# Patient Record
Sex: Male | Born: 1985 | Race: White | Hispanic: No | Marital: Single | Smoking: Current every day smoker
Health system: Southern US, Community
[De-identification: ages and names within clinical notes are randomized; demographics above are authoritative.]

## PROBLEM LIST (undated history)

## (undated) DIAGNOSIS — F909 Attention-deficit hyperactivity disorder, unspecified type: Secondary | ICD-10-CM

## (undated) DIAGNOSIS — F1010 Alcohol abuse, uncomplicated: Secondary | ICD-10-CM

## (undated) DIAGNOSIS — I10 Essential (primary) hypertension: Secondary | ICD-10-CM

## (undated) DIAGNOSIS — F32A Depression, unspecified: Secondary | ICD-10-CM

## (undated) DIAGNOSIS — F329 Major depressive disorder, single episode, unspecified: Secondary | ICD-10-CM

## (undated) DIAGNOSIS — F319 Bipolar disorder, unspecified: Secondary | ICD-10-CM

## (undated) DIAGNOSIS — E785 Hyperlipidemia, unspecified: Secondary | ICD-10-CM

---

## 2007-06-04 ENCOUNTER — Inpatient Hospital Stay (HOSPITAL_COMMUNITY): Admission: EM | Admit: 2007-06-04 | Discharge: 2007-06-07 | Payer: Self-pay | Admitting: *Deleted

## 2007-06-04 ENCOUNTER — Emergency Department (HOSPITAL_COMMUNITY): Admission: EM | Admit: 2007-06-04 | Discharge: 2007-06-04 | Payer: Self-pay | Admitting: Emergency Medicine

## 2007-06-05 ENCOUNTER — Ambulatory Visit: Payer: Self-pay | Admitting: *Deleted

## 2007-06-26 ENCOUNTER — Inpatient Hospital Stay (HOSPITAL_COMMUNITY): Admission: AD | Admit: 2007-06-26 | Discharge: 2007-07-05 | Payer: Self-pay | Admitting: Psychiatry

## 2011-02-02 NOTE — H&P (Signed)
Luis Acosta, Luis Acosta              ACCOUNT NO.:  [id number]   MEDICAL RECORD NO.:  [id number]          PATIENT TYPE:  IPS   LOCATION:  0403                          FACILITY:  BH   PHYSICIAN:  Geoffery Lyons, M.D.      DATE OF BIRTH:  July 12, 1986   DATE OF ADMISSION:  06/04/2007  DATE OF DISCHARGE:                       PSYCHIATRIC ADMISSION ASSESSMENT   IDENTIFYING INFORMATION:  This is a 25 year old white male, single.  This is an involuntary admission.   HISTORY OF PRESENT ILLNESS:  This patient was brought to the emergency  room by his parents for bizarre behavior.  He had gotten agitated and  through a television out the window saying that he was not sure what was  real and what was not real but felt that TV was part of promoting things  that were not real and it would be better to just get rid of it.  He  denies any current history of substance abuse.  He reports that he has  been under a lot of stress, feeling that work is too much for him.  He  is taking too many classes.  Not sure where to turn with all of his  anxiety.  Had denied any suicidal thoughts.  He denies any current  substance abuse and his urine drug screen was negative.   PAST PSYCHIATRIC HISTORY:  This is the patient's first admission to  [facility], first inpatient admission.  No  known history of either inpatient or outpatient psychiatric care.  Denies any history of psychotropics.  Denies history of brain injury,  learning disability, or regular substance abuse.  He does report that he  has a history of one blackout in the past when he was drinking too much  alcohol.  Denies any prior history of suicidal thoughts or attempts.   SOCIAL HISTORY:  Single, white male, never married.  No children.  He is  a Holiday representative at [employer].  Studying business, making A's and B's in  school.  Reports that he has one sister [place] in a  [place].  The patient himself is currently  living in a dormitory   FAMILY HISTORY:  Remarkable for mother with bipolar, type 1, disorder.   MEDICAL HISTORY:  Primary care Lesly Joslyn is not known.  He denies any  current medical problems.  Past medical history is negative for seizure.  He has a history of prior wisdom tooth extraction as an outpatient and  at one time fell from a ladder and lacerated his leg.  He has one  hospitalization in the ICU for an anaphylactic reaction to STRAWBERRIES  in the past.   MEDICATIONS:  Currently are none.   ALLERGIES:  None.   POSITIVE PHYSICAL FINDINGS:  Full physical exam was done in the  emergency room.  On presentation, generally a healthy-appearing white  male, afebrile, temperature 97.3, pulse 108, respirations 18, blood  pressure 160/97.   LABORATORY DATA:  Urine drug screen was negative for all substances.  Acetaminophen and salicylate levels were unremarkable.  Routine  urinalysis within normal limits.  Alcohol  level less than 5.  Chemistry  was remarkable for a potassium of 3.3 and CBC mild elevation and  leukocytes of 12.7.  Hemoglobin 16.3, hematocrit 47.7 and platelets  365,000, MCV at 89.9.  Renal function within normal limits.  Hepatic  function and TSH are currently pending.   MENTAL STATUS EXAM:  Fully alert male.  He is cooperative and pleasant  with a guarded affect.  Has been pretty reclusive to his room today.  He  is polite, cooperative.  Speech reflects increased production, no  pressure, is hyperverbal.  Mood mildly anxious.  Affect is guarded.  Thought process reflects quite a bit of rumination about his stressors,  where he is in the world, feeling that maybe he does not need to be in  school at all, needs to get a job, go away, experienced the world,  having difficulty deciding what he wants to do.  No clear suicidal  thoughts.  Has made some references to evil, evil voices, exactly the  nature of this is unclear and he becomes quite guarded.  Some loosening   of associations.  He is oriented to person, place and situation.  Has  made references that he feels his parents are supportive and willing to  talk about his sister and what group she is in.  Has made references to  her being in a cult.  No clear homicidal thoughts.  No expression of  suicidal thoughts.   DIAGNOSES:  AXIS I:  Rule out bipolar disorder with psychosis.  AXIS II:  Deferred.  AXIS III:  No diagnosis.  AXIS IV:  Moderate to severe (school stressors).  AXIS V:  Current 25-26; past year not known.   PLAN:  To involuntarily admit the patient.  He is in our intensive care  unit and we have prescribed Risperdal p.r.n. 0.5 mg q.4h. M-Tab and  Ativan 1 mg q.4h. p.r.n. for anxiety.  We are going to try to get in  touch with his parents and try to get some corroboration.  He tried to  determine what of his perceptions may be delusional versus real.  He has  also made some references to being concerned about the media and  reviewing May 31, 2000 terrorists' concerns.  Parents are willing  to come to a family session tomorrow.  We will also check a TSH and  hepatic function which are currently pending.   ESTIMATED LENGTH OF STAY:  Five days.      Margaret A. Scott, N.P.      Geoffery Lyons, M.D.  Electronically Signed    MAS/MEDQ  D:  06/05/2007  T:  06/05/2007  Job:  [id number]

## 2011-02-02 NOTE — Discharge Summary (Signed)
Luis Acosta, TUCKEY NO.:  [id number]   MEDICAL RECORD NO.:  [id number]          PATIENT TYPE:  IPS   LOCATION:  0403                          FACILITY:  BH   PHYSICIAN:  Geoffery Lyons, M.D.      DATE OF BIRTH:  1985-11-17   DATE OF ADMISSION:  06/04/2007  DATE OF DISCHARGE:  06/07/2007                               DISCHARGE SUMMARY   CHIEF COMPLAINT/HISTORY OF PRESENT ILLNESS:  This was the first  admission to [facility] for this 25 year old white male,  single, voluntarily committed brought to the ED by his parents for  bizarre behavior. I got agitated and threw a television out the  window, saying he was not sure what was real or what was not.  He felt  that the TV was wrong, promoting things that were not real and it would  be better to just get rid of it.  He is under a lot of stress, feeling  that work is too much for him and he is taking too many classes.   PAST PSYCHIATRIC HISTORY:  This is the first time at [facility].  No current treatment.   ALCOHOL HISTORY:  No prior history.  History of one blackout in the past  when was drinking too much.   SUBSTANCE ABUSE HISTORY:  No current substance use.  UDS negative.   MEDICAL HISTORY:  Noncontributory.   MEDICATIONS:  None.   PHYSICAL EXAMINATION:  Physical exam was performed and failed to show  any acute findings.   LABORATORY WORK-UP:  Potassium 3.3.  CBC - leukocytes 12.7, hemoglobin  16.3.  Renal function within normal limits.  Hepatic function and TSH  within normal limits.   MENTAL STATUS EXAM:  Reveals an alert, cooperative male, pleasant,  somewhat guarded.  Reclusive but polite and cooperative, hyper-verbal,  anxious,  rumination about his stressors, where he is in the world;  feeling that maybe he does not need to be in school at all.  He needs to  get a job, go away, be in the world.  He is having difficulty what he  wants to do.  He has made some reference to evil  voices.  He would not  elaborate and would not clarify circumstantiality.  Cognition well-  preserved.   ADMITTING DIAGNOSES:  AXIS I:  Rule out bipolar with psychoses, rule out  psychotic NOS, rule out schizoaffective disorder.  AXIS II:  No diagnosis.  AXIS III:  No diagnosis.  AXIS IV:  Moderate.  GAF on admission 25.  GAF in the last year 70-75.   COURSE IN THE HOSPITAL:  He was admitted, started in individual and  group psychotherapy.  As previously stated, this is a 25 year old male.  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  Endorsed that he really did not know what was going on.  He  claims he is confused on what is real and what is not.  He wants to know  what is [facility] and what is not, if all those people are dying.  He  wanted to go and see for himself.  He has had difficulty with sleep over  the last few days.  He was still going to school up until this week.  He  is still worried.  He recently broke up with a girlfriend but could not  see how this affected him.  He threw out the TV and then later picked it  up and threw it in front of the American flag.  No alcohol for the last  year.  Diagnosed with ADHD.  He was given Adderall at [facility] and  [facility].   FAMILY HISTORY:  Apparently mom was diagnosed with bipolar disorder.  He  basically was able to keep himself isolated and did interact with some  patients.  He was able to pull himself together to exhibit also  appropriate behavior.  During family session with his parents his mother  shared that she was bipolar and has been on treatment for a long time.  She admitted that she did not think her son could make it in college.  He endorsed that he was angry at her for telling him how limited or un-  special he was.  He wanted to prove to her that he is as smart and  capable as other people in the family who are successful.  He did not  want to take medications and wanted  to prove to people that he was  normal and also because of all the negative things the mother has said  about the medications through the years.  He wanted to go back to  school.  Mother was recommending that he drop some classes and he was  upset because of that.   On September 16th he was still firm that he was not going to take  medications.  Endorsed that his mother has had mental issues all through  his growing up years.  By the 16th there was some ruminations but were  decreasing in intensity.  He was able to stay on task and able to  tolerate interaction without overt paranoia.  He is still worried about  the situation in the world, but able to convince himself to go back to  school and finish his degree and then be able to go out and help people  if he needed to do so.  September 17th, endorsed that his medication was  being in the unit, feeling that by removing himself from the situation  he was able to appreciate what was going on with him.  Endorsed he was  under a lot of stress trying to normalize some of his feelings and  reactions.  He denied any suicidal ideation or homicidal ideation.  There was no overt paranoia.  There were some ruminations but none of  this would justify having to force medications on him.  He wants to go  back to school and is willing to see a counselor.   DISCHARGE DIAGNOSES:  AXIS I:  Psychotic disorder, not otherwise  specified.  Bipolar disorder with psychotic features.  AXIS II:  No diagnosis.  AXIS III:  No diagnosis.  AXIS IV:  Moderate.  GAF on discharge 50.   DISCHARGE MEDICATIONS:  Discharged on no medications.   FOLLOW-UP:  Follow up at the [facility]      Geoffery Lyons, M.D.  Electronically Signed     IL/MEDQ  D:  06/27/2007  T:  06/28/2007  Job:  [id number]

## 2011-02-05 NOTE — Discharge Summary (Signed)
NAMEKYUSS, HALE NO.:  [id number]   MEDICAL RECORD NO.:  [id number]          PATIENT TYPE:  IPS   LOCATION:  0503                          FACILITY:  BH   PHYSICIAN:  Geoffery Lyons, M.D.      DATE OF BIRTH:  02/28/1986   DATE OF ADMISSION:  06/26/2007  DATE OF DISCHARGE:  07/05/2007                               DISCHARGE SUMMARY   CHIEF COMPLAINT AND PRESENT ILLNESS:  This was the second admission to  [facility] for this 25 year old single white male  voluntarily admitted.  Apparently, he had a verbal altercation with his  roommate.  He was rambling.  He exhibited some grandiose thinking; that  he could communicate in multiple languages the argument seems to have  escalated.  He was asked to leave the dorm.   PAST PSYCHIATRIC HISTORY:  He was admitted previously to [facility].  At that particular time, he was also exhibiting mood  fluctuations with what seemed to be some psychotic features.   ALCOHOL/DRUG HISTORY:  He denies abuse of any substances.   MEDICAL HISTORY:  Noncontributory.   MEDICATIONS:  None.   PHYSICAL EXAMINATION:  Performed.  Failed to show any acute findings.   LABORATORY WORKUP:  White blood cells 9.2, hemoglobin 14.8, sodium 140,  potassium 4.2, glucose 99, SGOT 25, SGPT 31, total bilirubin 0.7, TSH  1.349.  Drug screen negative for substances of abuse.   MENTAL STATUS EXAM:  Reveals an alert, cooperative male initially  somewhat resistant, somewhat guarded.  His spontaneous thought  production was somewhat irrelevant, rambling in nature.  Some underlying  grandiosity.  Some underlying paranoia.  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  Cognition  preserved.   ADMISSION DIAGNOSIS:  AXIS I: Rule out bipolar disorder with psychotic  features, rule out psychosis NOS.  AXIS II: No  diagnosis.  AXIS III:  No diagnosis.  AXIS IV: Moderate.  AXIS V:  Upon admission 35.  GAF in last year 70.   HOSPITAL COURSE:  He was admitted.  He was started in individual and  group psychotherapy.  Initially, he was very hesitant to take  medications.  We placed him on Zyprexa on an as needed basis.  As  already stated, a 25 year old may recently discharged from this unit on  no medications as he refused to take them.  Claimed he had any event  with a roommate.  He got upset.  On evaluation, he is very disorganized.  There are some loose associations, some tangentiality, some delusional  ideas, grandiosity, ruminations.  Wanting to go to the [place]  and disappear there.  Wants to start all over again.  He was questioning  peoples' intentions, sanity.  He was pretty labile, pretty anxious,  irritable, unwilling to take medications.  Asked if this decision was  going to strap him down and force medicine on him.   On October 7, we got a second opinion by Dr. Electa Sniff  who points out the  fact that he was delusional, agitated, grandiose, tangential, with no  insight.  His opinion was that he was not going to improve clinically  without medications and forcing medication could be justified.  We went  back with some other staff members to try to interact and get him to  take the medications.  He continued to be very disorganized.  There were  some __________  associations and perseveration, but through the  interaction with the unit nurse director, he was willing to take  medications.  He was compliant with taking Zyprexa from then on.  He was  compliant with medications which ever medication was recommended.  We  pursued the Zyprexa.  We added Depakote.  On October 8, there were some  episodes of agitation, but he did accept the Zyprexa.  We adjusted the  medication.  Zyprexa was increased up to 20 mg.  On October 10, he was  feeling caged in.  There was some acting out, but he  was able to be  redirected.  He was taking all of the medications offered.  He heard  that he was not going to be able to go back to the same place he was  staying at and also that he was probably going to be better if he  totally withdrew from school.  Started thinking that he would be better  off going to [place] with his father while he gets himself back  together.  He was sleeping through the night.  On October 12, he  continued to be somewhat hypomanic, hyperverbal, expansive.  On October  13, he was circumstantial at times, tangential, expansive; but he was  taking his medication.  He was tolerating the medications well.  At that  particular time, Depakote level was 46.  He was willing to take a  Medical withdrawal, go to [place], find a job, get himself better and  get back to school possibly spring semester if not later.  On October  14, he was more focused, organized in his thinking process.  Was going  to get a withdrawal so he can get some reimbursement.  Still was wanting  to get the medical withdrawal and find a job in [place].  There was  some concern as far as the way people were going to look at him after  this incident; so there was some increased insight as this whole process  continued to evolve.  We continued to work with the Zyprexa and the  Depakote.  There was a family session on October 14.  He was willing to  stay on his medications and pursue outpatient treatment.  On October 15,  he was much improved, more organized, no suicidal or homicidal ideas, no  visual hallucinations or delusions.  Endorsed he was going to keep the  medication.  He was going to follow up on an outpatient basis and  depending on how things went, be back in school spring or 12/2007.   DISCHARGE DIAGNOSIS:  AXIS I: Bipolar disorder with some psychotic  features.  AXIS II: No diagnosis.  AXIS III:  No diagnosis.  AXIS IV: Moderate.  AXIS V: Upon discharge 50.   DISCHARGE  MEDICATIONS:  1. Zyprexa Zydis 20 mg at bedtime.  2. Depakote ER 1500 mg at bedtime.  3. Trazodone 50 mg at bedtime as needed for sleep.   DISCHARGE FOLLOWUP:  Followup is at [facility] in [place],  [place].  Geoffery Lyons, M.D.  Electronically Signed     IL/MEDQ  D:  07/31/2007  T:  08/01/2007  Job:  [id number]

## 2011-07-01 LAB — DRUGS OF ABUSE SCREEN W/O ALC, ROUTINE URINE
Amphetamine Screen, Ur: NEGATIVE
Benzodiazepines.: NEGATIVE
Creatinine,U: 300.7
Marijuana Metabolite: NEGATIVE
Methadone: NEGATIVE
Propoxyphene: NEGATIVE

## 2011-07-01 LAB — URINALYSIS, ROUTINE W REFLEX MICROSCOPIC
Bilirubin Urine: NEGATIVE
Glucose, UA: NEGATIVE
Hgb urine dipstick: NEGATIVE
Ketones, ur: NEGATIVE
Nitrite: NEGATIVE
Protein, ur: NEGATIVE
Specific Gravity, Urine: 1.037 — ABNORMAL HIGH
Urobilinogen, UA: 0.2
pH: 6
pH: 6.5

## 2011-07-01 LAB — COMPREHENSIVE METABOLIC PANEL
ALT: 31
AST: 25
Alkaline Phosphatase: 86
CO2: 30
Chloride: 101
GFR calc Af Amer: >60
GFR calc non Af Amer: >60
Glucose, Bld: 99
Potassium: 4.2
Sodium: 140
Total Bilirubin: 0.7

## 2011-07-01 LAB — MAGNESIUM: Magnesium: 2.2

## 2011-07-01 LAB — CBC
Hemoglobin: 14.8
MCHC: 34.2
RBC: 4.84
WBC: 9.2

## 2011-07-02 LAB — DIFFERENTIAL
Basophils Relative: 0
Eosinophils Relative: 0
Lymphocytes Relative: 21
Monocytes Absolute: 0.8 — ABNORMAL HIGH
Monocytes Relative: 7
Neutro Abs: 9.1 — ABNORMAL HIGH

## 2011-07-02 LAB — CBC
HCT: 47.7
Hemoglobin: 16.3
MCHC: 34.2
RBC: 5.3
RDW: 13.5

## 2011-07-02 LAB — URINALYSIS, ROUTINE W REFLEX MICROSCOPIC
Nitrite: NEGATIVE
Protein, ur: NEGATIVE
Specific Gravity, Urine: 1.006
Urobilinogen, UA: 0.2

## 2011-07-02 LAB — SALICYLATE LEVEL
Salicylate Lvl: <4
Salicylate Lvl: <4

## 2011-07-02 LAB — I-STAT 8, (EC8 V) (CONVERTED LAB)
BUN: 6
Chloride: 105
Glucose, Bld: 104 — ABNORMAL HIGH
HCT: 52
Hemoglobin: 17.7 — ABNORMAL HIGH
Operator id: 257131
Sodium: 140

## 2011-07-02 LAB — RAPID URINE DRUG SCREEN, HOSP PERFORMED
Amphetamines: NOT DETECTED
Barbiturates: NOT DETECTED

## 2011-07-02 LAB — ACETAMINOPHEN LEVEL: Acetaminophen (Tylenol), Serum: <10 — ABNORMAL LOW

## 2014-11-12 DIAGNOSIS — I10 Essential (primary) hypertension: Secondary | ICD-10-CM | POA: Insufficient documentation

## 2015-10-29 DIAGNOSIS — F3181 Bipolar II disorder: Secondary | ICD-10-CM | POA: Diagnosis present

## 2015-10-29 DIAGNOSIS — F1021 Alcohol dependence, in remission: Secondary | ICD-10-CM | POA: Insufficient documentation

## 2016-02-02 DIAGNOSIS — Z6835 Body mass index (BMI) 35.0-35.9, adult: Secondary | ICD-10-CM | POA: Insufficient documentation

## 2016-03-26 ENCOUNTER — Ambulatory Visit (HOSPITAL_BASED_OUTPATIENT_CLINIC_OR_DEPARTMENT_OTHER): Payer: BLUE CROSS/BLUE SHIELD | Attending: Family Medicine | Admitting: Internal Medicine

## 2016-03-26 VITALS — Ht 73.0 in | Wt 290.0 lb

## 2016-03-26 DIAGNOSIS — R0683 Snoring: Secondary | ICD-10-CM | POA: Diagnosis not present

## 2016-03-26 DIAGNOSIS — R5383 Other fatigue: Secondary | ICD-10-CM | POA: Insufficient documentation

## 2016-03-26 DIAGNOSIS — E669 Obesity, unspecified: Secondary | ICD-10-CM | POA: Insufficient documentation

## 2016-03-26 DIAGNOSIS — R51 Headache: Secondary | ICD-10-CM | POA: Insufficient documentation

## 2016-03-26 DIAGNOSIS — G478 Other sleep disorders: Secondary | ICD-10-CM | POA: Insufficient documentation

## 2016-03-26 DIAGNOSIS — G4700 Insomnia, unspecified: Secondary | ICD-10-CM | POA: Insufficient documentation

## 2016-03-26 DIAGNOSIS — Z6838 Body mass index (BMI) 38.0-38.9, adult: Secondary | ICD-10-CM | POA: Insufficient documentation

## 2016-04-03 DIAGNOSIS — G478 Other sleep disorders: Secondary | ICD-10-CM | POA: Diagnosis not present

## 2016-04-03 NOTE — Procedures (Signed)
  Patient Name: Luis Acosta, Luis Acosta Date: 03/26/2016 Gender: Male D.O.B: 03/28/1986 Age (years): 29 Referring Provider: Willow Oraamille L Andy Height (inches): 73 Interpreting Physician: Jetty Duhamellinton Young MD, ABSM Weight (lbs): 290 RPSGT: Shelah LewandowskyGregory, Kenyon BMI: 38 MRN: [id number] Neck Size: 17.50 CLINICAL INFORMATION Sleep Acosta Type: NPSG Indication for sleep Acosta: Daytime Fatigue, Fatigue, Morning Headaches, Obesity, Parasomnias, Snoring, Witnesses Apnea / Gasping During Sleep Epworth Sleepiness Score: 5  SLEEP Acosta TECHNIQUE As per the AASM Manual for the Scoring of Sleep and Associated Events v2.3 (April 2016) with a hypopnea requiring 4% desaturations. The channels recorded and monitored were frontal, central and occipital EEG, electrooculogram (EOG), submentalis EMG (chin), nasal and oral airflow, thoracic and abdominal wall motion, anterior tibialis EMG, snore microphone, electrocardiogram, and pulse oximetry.  MEDICATIONS Patient's medications include: charted fopr review. Medications self-administered by patient during sleep Acosta : No sleep medicine administered.  SLEEP ARCHITECTURE The Acosta was initiated at 10:31:31 PM and ended at 4:47:56 AM. Sleep onset time was 63.4 minutes and the sleep efficiency was 48.2%. The total sleep time was 181.5 minutes. Stage REM latency was 237.0 minutes. The patient spent 19.85% of the night in stage N1 sleep, 63.07% in stage N2 sleep, 7.99% in stage N3 and 9.09% in REM. Alpha intrusion was absent. Supine sleep was 50.43%. Wake after sleep onset 131 minutes  RESPIRATORY PARAMETERS The overall apnea/hypopnea index (AHI) was 2.0 per hour. There were 1 total apneas, including 0 obstructive, 1 central and 0 mixed apneas. There were 5 hypopneas and 68 RERAs. The AHI during Stage REM sleep was 0.0 per hour. AHI while supine was 2.6 per hour. The mean oxygen saturation was 95.83%. The minimum SpO2 during sleep was 93.00%. Moderate snoring was  noted during this Acosta.  CARDIAC DATA The 2 lead EKG demonstrated sinus rhythm. The mean heart rate was 73.11 beats per minute. Other EKG findings include: None.  LEG MOVEMENT DATA The total PLMS were 0 with a resulting PLMS index of 0.00. Associated arousal with leg movement index was 0.0 .  IMPRESSIONS - No significant obstructive sleep apnea occurred during this Acosta (AHI = 2.0/h). - No significant central sleep apnea occurred during this Acosta (CAI = 0.3/h). - The patient had minimal or no oxygen desaturation during the Acosta (Min O2 = 93.00%) - The patient snored with Moderate snoring volume. - Patient had significant difficulty initiating and maintaining sleep- insomnia - No cardiac abnormalities were noted during this Acosta. - Clinically significant periodic limb movements did not occur during sleep. No significant associated arousals.  DIAGNOSIS - Primary Snoring (786.09 [R06.83 ICD-10]) - Normal Acosta - Difficulty initiating and maintaining sleep- Insomnia  RECOMMENDATIONS - Avoid alcohol, sedatives and other CNS depressants that may worsen sleep apnea and disrupt normal sleep architecture. - Sleep hygiene should be reviewed to assess factors that may improve sleep quality. - Consider if insomnia is a sustained/ recurrent problem needing specific attention. - Weight management and regular exercise should be initiated or continued if appropriate.  [Electronically signed] 04/03/2016 09:17 AM  Jetty Duhamellinton Young MD, ABSM Diplomate, American Board of Sleep Medicine   NPI: [id number]  Waymon BudgeYOUNG,CLINTON D Diplomate, American Board of Sleep Medicine  ELECTRONICALLY SIGNED ON:  04/03/2016, 9:11 AM [facility] PH: [phone]   FX: [phone] ACCREDITED BY THE AMERICAN ACADEMY OF SLEEP MEDICINE

## 2016-06-01 DIAGNOSIS — F17210 Nicotine dependence, cigarettes, uncomplicated: Secondary | ICD-10-CM | POA: Insufficient documentation

## 2016-06-18 ENCOUNTER — Encounter (HOSPITAL_COMMUNITY): Payer: Self-pay | Admitting: Emergency Medicine

## 2016-06-18 ENCOUNTER — Emergency Department (HOSPITAL_COMMUNITY)
Admission: EM | Admit: 2016-06-18 | Discharge: 2016-06-18 | Payer: BLUE CROSS/BLUE SHIELD | Attending: Emergency Medicine | Admitting: Emergency Medicine

## 2016-06-18 DIAGNOSIS — R45851 Suicidal ideations: Secondary | ICD-10-CM | POA: Diagnosis present

## 2016-06-18 DIAGNOSIS — F3163 Bipolar disorder, current episode mixed, severe, without psychotic features: Secondary | ICD-10-CM | POA: Diagnosis not present

## 2016-06-18 DIAGNOSIS — Z79899 Other long term (current) drug therapy: Secondary | ICD-10-CM | POA: Diagnosis not present

## 2016-06-18 DIAGNOSIS — F17200 Nicotine dependence, unspecified, uncomplicated: Secondary | ICD-10-CM | POA: Insufficient documentation

## 2016-06-18 DIAGNOSIS — F3160 Bipolar disorder, current episode mixed, unspecified: Secondary | ICD-10-CM | POA: Diagnosis present

## 2016-06-18 LAB — RAPID URINE DRUG SCREEN, HOSP PERFORMED
Amphetamines: POSITIVE — AB
Barbiturates: NOT DETECTED
Benzodiazepines: NOT DETECTED
Cocaine: NOT DETECTED
Opiates: NOT DETECTED
Tetrahydrocannabinol: NOT DETECTED

## 2016-06-18 LAB — CBC
HEMATOCRIT: 45.9 % (ref 39.0–52.0)
Hemoglobin: 16.2 g/dL (ref 13.0–17.0)
MCH: 32.1 pg (ref 26.0–34.0)
MCHC: 35.3 g/dL (ref 30.0–36.0)
MCV: 90.9 fL (ref 78.0–100.0)
PLATELETS: 322 10*3/uL (ref 150–400)
RBC: 5.05 MIL/uL (ref 4.22–5.81)
RDW: 13 % (ref 11.5–15.5)
WBC: 11 10*3/uL — AB (ref 4.0–10.5)

## 2016-06-18 LAB — COMPREHENSIVE METABOLIC PANEL
ALT: 162 U/L — ABNORMAL HIGH (ref 17–63)
AST: 73 U/L — ABNORMAL HIGH (ref 15–41)
Albumin: 5.1 g/dL — ABNORMAL HIGH (ref 3.5–5.0)
Alkaline Phosphatase: 77 U/L (ref 38–126)
Anion gap: 12 (ref 5–15)
BUN: 10 mg/dL (ref 6–20)
CO2: 21 mmol/L — ABNORMAL LOW (ref 22–32)
Calcium: 9.4 mg/dL (ref 8.9–10.3)
Chloride: 106 mmol/L (ref 101–111)
Creatinine, Ser: 0.97 mg/dL (ref 0.61–1.24)
GFR calc Af Amer: >60 mL/min (ref >60)
GFR calc non Af Amer: >60 mL/min (ref >60)
Glucose, Bld: 110 mg/dL — ABNORMAL HIGH (ref 65–99)
Potassium: 3.1 mmol/L — ABNORMAL LOW (ref 3.5–5.1)
Sodium: 139 mmol/L (ref 135–145)
Total Bilirubin: 0.7 mg/dL (ref 0.3–1.2)
Total Protein: 8.4 g/dL — ABNORMAL HIGH (ref 6.5–8.1)

## 2016-06-18 LAB — ACETAMINOPHEN LEVEL: Acetaminophen (Tylenol), Serum: <10 ug/mL — ABNORMAL LOW (ref 10–30)

## 2016-06-18 LAB — SALICYLATE LEVEL: Salicylate Lvl: <4 mg/dL (ref 2.8–30.0)

## 2016-06-18 LAB — ETHANOL

## 2016-06-18 MED ORDER — HALOPERIDOL LACTATE 5 MG/ML IJ SOLN
5.0000 mg | Freq: Once | INTRAMUSCULAR | Status: AC
  Administered 2016-06-18: 5 mg via INTRAMUSCULAR
  Filled 2016-06-18: qty 1

## 2016-06-18 MED ORDER — ZIPRASIDONE MESYLATE 20 MG IM SOLR
20.0000 mg | INTRAMUSCULAR | Status: AC | PRN
  Administered 2016-06-18: 20 mg via INTRAMUSCULAR
  Filled 2016-06-18: qty 20

## 2016-06-18 MED ORDER — LORAZEPAM 2 MG/ML IJ SOLN
2.0000 mg | Freq: Once | INTRAMUSCULAR | Status: AC
  Administered 2016-06-18: 2 mg via INTRAMUSCULAR
  Filled 2016-06-18: qty 1

## 2016-06-18 MED ORDER — ZIPRASIDONE MESYLATE 20 MG IM SOLR: 20.0000 mg | Freq: Once | INTRAMUSCULAR | Status: DC

## 2016-06-18 MED ORDER — ASENAPINE MALEATE 5 MG SL SUBL
10.0000 mg | SUBLINGUAL_TABLET | Freq: Two times a day (BID) | SUBLINGUAL | Status: DC
  Administered 2016-06-18: 10 mg via SUBLINGUAL
  Filled 2016-06-18 (×2): qty 2

## 2016-06-18 MED ORDER — POTASSIUM CHLORIDE CRYS ER 20 MEQ PO TBCR
40.0000 meq | EXTENDED_RELEASE_TABLET | Freq: Once | ORAL | Status: AC
  Administered 2016-06-18: 40 meq via ORAL
  Filled 2016-06-18: qty 2

## 2016-06-18 NOTE — ED Notes (Signed)
Luis Acosta ATTEMPTED X 1 FOR BLOOD DRAW NOT SUCCESSFUL.

## 2016-06-18 NOTE — ED Notes (Signed)
Bed: WA16 Expected date:  Expected time:  Means of arrival:  Comments: 

## 2016-06-18 NOTE — ED Notes (Signed)
Pt has stated that he wants to see his mom because he does not understand why she has done this to him.

## 2016-06-18 NOTE — ED Notes (Addendum)
Pt was stripped and changed in front of security and never left securities site before coming back.

## 2016-06-18 NOTE — ED Notes (Signed)
TTS PRESENT 

## 2016-06-18 NOTE — BH Assessment (Signed)
[facility] Assessment Progress Note  The following facilities have been contacted to seek placement for this pt, with results as noted:  [facility], information sent, decision pending[facility]   [facility]   Doylene Canninghomas Matai [place], [place] Triage Specialist [phone]

## 2016-06-18 NOTE — ED Notes (Signed)
MD at bedside. 

## 2016-06-18 NOTE — ED Notes (Signed)
[facility].  All belongings were sent with patient.

## 2016-06-18 NOTE — ED Notes (Signed)
Pt attempted to urinate. Stated that it was awkward to use urinal. Was not able to provide urine sample but tried.

## 2016-06-18 NOTE — ED Notes (Addendum)
RESTRAINTS OFF. PT SITTING IN CHAIR. PT CURRENTLY CALM AND COOPERATIVE. CHARGE SUSAN C RN WITNESSING BEHAVIOR. MOVE TO SAPPU. SECURITY PRESENT.

## 2016-06-18 NOTE — ED Triage Notes (Addendum)
Pt brought in by [employer], IVC'd. Pt was aggressive with [employer] and had to be handcuffed. Per IVC papers, pt has hx of bipolar and only occasionally takes medications. Reported SI with plan to shoot self. Has also been abusing etoh and cutting arms. Pt denies SI/HI

## 2016-06-18 NOTE — ED Notes (Signed)
AWARE OF PT CURRENT STATUS. POTASSIUM PENDING. OUT OF RESTRAINTS AT AT 12:50. MEDICATION GIVEN BEFORE TRANSFER TO SAPPU. IVC PAPERS

## 2016-06-18 NOTE — ED Notes (Signed)
Delay on blood due to aggressive behavior

## 2016-06-18 NOTE — ED Provider Notes (Signed)
[facility]-EMERGENCY DEPT Provider Note   CSN: [id number] Arrival date & time: 06/18/16  [id number]     History   Chief Complaint Chief Complaint  Patient presents with  . IVC  . Suicidal  . Aggressive Behavior    HPI Luis Acosta is a 30 y.o. male.  HPI  Patient was brought in by PD with IVC paperwork obtained by mother concerning for psychosis/mania. Patient is denying any SI, HI, ADH. Unfortunately patient has not cooperating with questioning. He is verbally aggressive. I spoke with the mother reports that the patient has been having difficulty sleeping and has been having delusions that the bag discharge to get his money. She reports that the patient took out at least half his money out of the bank account. She also reports that the patient's travel to [place] and noted to fix something with his passport. She had to drive up and find him due to the fact that he got lost and would not use GPS because he did not want to be tracked.   History reviewed. No pertinent past medical history.  Patient Active Problem List   Diagnosis Date Noted  . Bipolar affective disorder, current episode mixed ([name]) 06/18/2016    History reviewed. No pertinent surgical history.     Home Medications    Prior to Admission medications   Medication Sig Start Date End Date Taking? Authorizing Provider  amphetamine-dextroamphetamine (ADDERALL XR) 30 MG 24 hr capsule Take 30 mg by mouth daily.   Yes Historical Provider, MD  buPROPion (WELLBUTRIN XL) 300 MG 24 hr tablet Take 300 mg by mouth daily.   Yes Historical Provider, MD    Family History History reviewed. No pertinent family history.  Social History Social History  Substance Use Topics  . Smoking status: Current Every Day Smoker  . Smokeless tobacco: Never Used  . Alcohol use Yes     Allergies   Other   Review of Systems Review of Systems  Unable to perform ROS: Psychiatric disorder     Physical Exam Updated Vital Signs BP  163/87   Pulse 88   Temp 98.4 F (36.9 C) (Oral)   Resp 21   SpO2 100%   Physical Exam  Constitutional: He is oriented to person, place, and time. He appears well-developed and well-nourished. No distress.  HENT:  Head: Normocephalic and atraumatic.  Nose: Nose normal.  Eyes: Conjunctivae and EOM are normal. Pupils are equal, round, and reactive to light. Right eye exhibits no discharge. Left eye exhibits no discharge. No scleral icterus.  Neck: Normal range of motion. Neck supple.  Cardiovascular: Normal rate and regular rhythm.  Exam reveals no gallop and no friction rub.   No murmur heard. Pulmonary/Chest: Effort normal and breath sounds normal. No stridor. No respiratory distress. He has no rales.  Abdominal: Soft. He exhibits no distension. There is no tenderness.  Musculoskeletal: He exhibits no edema or tenderness.  Neurological: He is alert and oriented to person, place, and time.  Skin: Skin is warm and dry. No rash noted. He is not diaphoretic. No erythema.  Psychiatric: He has a normal mood and affect.  Vitals reviewed.    ED Treatments / Results  Labs (all labs ordered are listed, but only abnormal results are displayed) Labs Reviewed  COMPREHENSIVE METABOLIC PANEL - Abnormal; Notable for the following:       Result Value   Potassium 3.1 (*)    CO2 21 (*)    Glucose, Bld 110 (*)  Total Protein 8.4 (*)    Albumin 5.1 (*)    AST 73 (*)    ALT 162 (*)    All other components within normal limits  ACETAMINOPHEN LEVEL - Abnormal; Notable for the following:    Acetaminophen (Tylenol), Serum <10 (*)    All other components within normal limits  CBC - Abnormal; Notable for the following:    WBC 11.0 (*)    All other components within normal limits  URINE RAPID DRUG SCREEN, HOSP PERFORMED - Abnormal; Notable for the following:    Amphetamines POSITIVE (*)    All other components within normal limits  ETHANOL  SALICYLATE LEVEL    EKG  EKG  Interpretation None       Radiology No results found.  Procedures Procedures (including critical care time)  Medications Ordered in ED Medications  asenapine (SAPHRIS) sublingual tablet 10 mg (10 mg Sublingual Given 06/18/16 1618)  ziprasidone (GEODON) injection 20 mg (20 mg Intramuscular Given 06/18/16 1100)  haloperidol lactate (HALDOL) injection 5 mg (5 mg Intramuscular Given 06/18/16 1256)  LORazepam (ATIVAN) injection 2 mg (2 mg Intramuscular Given 06/18/16 1257)  potassium chloride SA (K-DUR,KLOR-CON) CR tablet 40 mEq (40 mEq Oral Given 06/18/16 1618)     Initial Impression / Assessment and Plan / ED Course  I have reviewed the triage vital signs and the nursing notes.  Pertinent labs & imaging results that were available during my care of the patient were reviewed by me and considered in my medical decision making (see chart for details).  Clinical Course    Highly concerning for acute mania. Medical clearance labs obtained. Patient requires psychiatric evaluation.  Patient requires chemical sedation due to aggression.  Patient was medically cleared. Behavioral Health assess the patient and give the patient appropriate for inpatient treatment.   Final Clinical Impressions(s) / ED Diagnoses   Final diagnoses:  Bipolar disorder, current episode mixed, severe, without psychotic features ([name])    Disposition: Admit  Condition: Stable    Nira Conn, MD 06/18/16 1714

## 2016-06-18 NOTE — ED Notes (Signed)
Observed pt removing BIL wrist and ankle restraints. Pt demanding  to have water and to urinate. Security on stand by and Brain EMT present to assist pt with urination. Once redirected, Pt calm and cooperative with current situation. Pt continues to appear anxious and compulsive with behavior (agitated with staff). Pt medicated for comfort.Pt verbalized understanding.  Pt willing and passive to receive medication. Charge Junius RoadsSusan C RN to assist with administration of medication.

## 2016-06-18 NOTE — ED Notes (Addendum)
Pt oriented to room and unit.  Patient is calm right now.  Pt was given his lunch tray and shown his room.  Pt denies S/I and H/I, and AVH.  15 minute checks and video monitoring in place.

## 2016-06-18 NOTE — ED Notes (Signed)
MD at bedside.TTS PRESENT 
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
## 2016-06-18 NOTE — BH Assessment (Signed)
[facility] Assessment Progress Note  Per Nanine MeansJamison Lord, DNP, this pt requires psychiatric hospitalization at this time.  At 15:45 Misty StanleyLisa [facility].  Pt has been accepted to the [facility] West unit at their facility by Dr Roselle LocusSaeed.  Catha NottinghamJamison concurs with this decision.  Pt's nurse, Kendal Hymendie, has been notified, and agrees to call report to [phone].  Pt is under IVC [facility]  Doylene Canninghomas Ambria Mayfield, MA Triage Specialist [phone]

## 2018-05-14 ENCOUNTER — Emergency Department (HOSPITAL_COMMUNITY)
Admission: EM | Admit: 2018-05-14 | Discharge: 2018-05-15 | Disposition: A | Discharge status: Home / Self Care | Payer: BLUE CROSS/BLUE SHIELD | Attending: Emergency Medicine | Admitting: Emergency Medicine

## 2018-05-14 ENCOUNTER — Encounter (HOSPITAL_COMMUNITY): Payer: Self-pay | Admitting: Emergency Medicine

## 2018-05-14 DIAGNOSIS — F3161 Bipolar disorder, current episode mixed, mild: Secondary | ICD-10-CM | POA: Insufficient documentation

## 2018-05-14 DIAGNOSIS — I10 Essential (primary) hypertension: Secondary | ICD-10-CM | POA: Insufficient documentation

## 2018-05-14 DIAGNOSIS — Z79899 Other long term (current) drug therapy: Secondary | ICD-10-CM | POA: Insufficient documentation

## 2018-05-14 DIAGNOSIS — R4689 Other symptoms and signs involving appearance and behavior: Secondary | ICD-10-CM

## 2018-05-14 DIAGNOSIS — F918 Other conduct disorders: Secondary | ICD-10-CM | POA: Diagnosis present

## 2018-05-14 DIAGNOSIS — Y903 Blood alcohol level of 60-79 mg/100 ml: Secondary | ICD-10-CM | POA: Diagnosis not present

## 2018-05-14 DIAGNOSIS — F17200 Nicotine dependence, unspecified, uncomplicated: Secondary | ICD-10-CM | POA: Diagnosis not present

## 2018-05-14 DIAGNOSIS — F10280 Alcohol dependence with alcohol-induced anxiety disorder: Secondary | ICD-10-CM | POA: Diagnosis present

## 2018-05-14 DIAGNOSIS — F319 Bipolar disorder, unspecified: Secondary | ICD-10-CM

## 2018-05-14 DIAGNOSIS — Z9114 Patient's other noncompliance with medication regimen: Secondary | ICD-10-CM

## 2018-05-14 DIAGNOSIS — Z046 Encounter for general psychiatric examination, requested by authority: Secondary | ICD-10-CM | POA: Diagnosis not present

## 2018-05-14 DIAGNOSIS — F3160 Bipolar disorder, current episode mixed, unspecified: Secondary | ICD-10-CM | POA: Diagnosis present

## 2018-05-14 DIAGNOSIS — F1010 Alcohol abuse, uncomplicated: Secondary | ICD-10-CM

## 2018-05-14 HISTORY — DX: Essential (primary) hypertension: I10

## 2018-05-14 HISTORY — DX: Hyperlipidemia, unspecified: E78.5

## 2018-05-14 HISTORY — DX: Bipolar disorder, unspecified: F31.9

## 2018-05-14 HISTORY — DX: Major depressive disorder, single episode, unspecified: F32.9

## 2018-05-14 HISTORY — DX: Depression, unspecified: F32.A

## 2018-05-14 HISTORY — DX: Alcohol abuse, uncomplicated: F10.10

## 2018-05-14 HISTORY — DX: Attention-deficit hyperactivity disorder, unspecified type: F90.9

## 2018-05-14 LAB — CBC
HEMATOCRIT: 48.4 % (ref 39.0–52.0)
HEMOGLOBIN: 16.8 g/dL (ref 13.0–17.0)
MCH: 31.8 pg (ref 26.0–34.0)
MCHC: 34.7 g/dL (ref 30.0–36.0)
MCV: 91.7 fL (ref 78.0–100.0)
Platelets: 300 10*3/uL (ref 150–400)
RBC: 5.28 MIL/uL (ref 4.22–5.81)
RDW: 13.4 % (ref 11.5–15.5)
WBC: 7.1 10*3/uL (ref 4.0–10.5)

## 2018-05-14 LAB — COMPREHENSIVE METABOLIC PANEL
ALBUMIN: 4.8 g/dL (ref 3.5–5.0)
ALT: 223 U/L — ABNORMAL HIGH (ref 0–44)
ANION GAP: 12 (ref 5–15)
AST: 157 U/L — AB (ref 15–41)
Alkaline Phosphatase: 62 U/L (ref 38–126)
BUN: 8 mg/dL (ref 6–20)
CHLORIDE: 109 mmol/L (ref 98–111)
CO2: 21 mmol/L — ABNORMAL LOW (ref 22–32)
Calcium: 9.3 mg/dL (ref 8.9–10.3)
Creatinine, Ser: 0.82 mg/dL (ref 0.61–1.24)
GFR calc Af Amer: >60 mL/min (ref >60)
GFR calc non Af Amer: >60 mL/min (ref >60)
GLUCOSE: 132 mg/dL — AB (ref 70–99)
POTASSIUM: 3.9 mmol/L (ref 3.5–5.1)
Sodium: 142 mmol/L (ref 135–145)
Total Bilirubin: 0.4 mg/dL (ref 0.3–1.2)
Total Protein: 8.3 g/dL — ABNORMAL HIGH (ref 6.5–8.1)

## 2018-05-14 LAB — RAPID URINE DRUG SCREEN, HOSP PERFORMED
AMPHETAMINES: NOT DETECTED
BARBITURATES: NOT DETECTED
Benzodiazepines: NOT DETECTED
COCAINE: NOT DETECTED
Opiates: NOT DETECTED
TETRAHYDROCANNABINOL: NOT DETECTED

## 2018-05-14 LAB — ACETAMINOPHEN LEVEL: Acetaminophen (Tylenol), Serum: <10 ug/mL — ABNORMAL LOW (ref 10–30)

## 2018-05-14 LAB — SALICYLATE LEVEL: Salicylate Lvl: <7 mg/dL (ref 2.8–30.0)

## 2018-05-14 LAB — ETHANOL: ALCOHOL ETHYL (B): 65 mg/dL — AB (ref <10)

## 2018-05-14 MED ORDER — LORAZEPAM 1 MG PO TABS
0.0000 mg | ORAL_TABLET | Freq: Four times a day (QID) | ORAL | Status: DC
  Administered 2018-05-14 (×2): 1 mg via ORAL
  Filled 2018-05-14 (×2): qty 1

## 2018-05-14 MED ORDER — VITAMIN B-1 100 MG PO TABS
100.0000 mg | ORAL_TABLET | Freq: Every day | ORAL | Status: DC
  Administered 2018-05-14 – 2018-05-15 (×2): 100 mg via ORAL
  Filled 2018-05-14 (×2): qty 1

## 2018-05-14 MED ORDER — LORAZEPAM 1 MG PO TABS: 0.0000 mg | ORAL_TABLET | Freq: Two times a day (BID) | ORAL | Status: DC

## 2018-05-14 MED ORDER — SODIUM CHLORIDE 0.9 % IV BOLUS
1000.0000 mL | Freq: Once | INTRAVENOUS | Status: AC
  Administered 2018-05-14: 1000 mL via INTRAVENOUS

## 2018-05-14 MED ORDER — THIAMINE HCL 100 MG/ML IJ SOLN: 100.0000 mg | Freq: Every day | INTRAMUSCULAR | Status: DC

## 2018-05-14 MED ORDER — LORAZEPAM 2 MG/ML IJ SOLN: 0.0000 mg | Freq: Four times a day (QID) | INTRAMUSCULAR | Status: DC

## 2018-05-14 MED ORDER — LORAZEPAM 2 MG/ML IJ SOLN: 0.0000 mg | Freq: Two times a day (BID) | INTRAMUSCULAR | Status: DC

## 2018-05-14 NOTE — ED Notes (Signed)
On admission to the Acute Unit pt is irritable about being here. Has been talking to someone on the telephone continuously and complaining about being here.

## 2018-05-14 NOTE — BH Assessment (Signed)
Assessment Note  Luis Acosta is an 32 y.o. male who was brought in on IVC, petitioned by his mother who stated that patient is drinking daily, is combative and state that he is destroying property as well as being off his mental health medications.  Patient admitted to the fact that he is drinking 7+ beers a day and stated that he does have a history of depression and stated that he has not taken any medication in the past three years, but denied current SI/HI and Psychosis. He denied having any current withdrawal symptoms. He stated that he has never tried to hurt himself or anyone else. 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  He stated that he had to move in with his mother.  He stated that for the most part that they get along, but sometime his mother gets on tangents and does not know when to let things go and she goes on and on about it.  He stated that his drinking does not help the situation.  Patient stated that he has never been combative towards his mother and stated that he has never tried to hurt her.  Patient stated that he had never destroyed any property other than breaking a plate last nigh and he is not sure if he did it by accident or he was durng and got mad and purposely broke it.   When asked if he wanted to stop drinking, he stated, "Ummmm" and never answered the question.  He did state that he needed to stop for his mother.  Patient stated that he went to an AA Meeting last night and still came home and drank anyway.  Patient stated that he has been in treatment at [facility] for his depression and drinking, but never really followed up with any aftercare and stated that he did not remain compliant with taking his medications.  Patient stated that he has never been married, has no children.  He denied a history of abuse and self-mutilation.  Patient stated  that he was sleeping 8 hours per night and his appetite has been fair, but he has not lost any weight recently.  Patient presented as alert and oriented.  His eye contact was goo and his speech was clear.  His thoughts were organized.  His judgment and impulse control are poor.  He did not appear to be responding to any internal stimuli.  He appeared to be mild to moderately depressed, but had minimal anxiety.  Patient did not appear to have much interest in seeking help for himself in order to stop drinking. Patient stated that mental illness runs in his family and that his sister and grandfather have depression issues.    Diagnosis: F10.20 Alcohol Use Disorder Severe  Past Medical History:  Past Medical History:  Diagnosis Date  . ADHD   . Alcohol abuse   . Bipolar 1 disorder ([name])   . Depression   . Hyperlipidemia   . Hypertension     History reviewed. No pertinent surgical history.  Family History: History reviewed. No pertinent family history.  Social History:  reports that he has been smoking. He has never used smokeless tobacco. He reports that he drinks alcohol. His drug history is not on file.  Additional Social History:  Alcohol / Drug Use Pain Medications: denies Prescriptions: denies Over the Counter: denies  History of alcohol / drug use?: Yes Longest period of sobriety (when/how long): unable to assess Negative Consequences of Use: Personal relationships, Surveyor, quantity, Work / School Substance #1 Name of Substance 1: alcohol 1 - Age of First Use: 22 1 - Amount (size/oz): 7+ beers 1 - Frequency: daily 1 - Duration: since onset 1 - Last Use / Amount: 1 beer this morning  CIWA: CIWA-Ar BP: (!) 162/77 Pulse Rate: (!) 101 Nausea and Vomiting: no nausea and no vomiting Tactile Disturbances: none Tremor: not visible, but can be felt fingertip to fingertip Auditory Disturbances: not present Paroxysmal Sweats: two Visual Disturbances: not present Anxiety: mildly  anxious Headache, Fullness in Head: none present Agitation: somewhat more than normal activity Orientation and Clouding of Sensorium: oriented and can do serial additions CIWA-Ar Total: 5 COWS:    Allergies:  Allergies  Allergen Reactions  . Other Anaphylaxis    Strawberries    Home Medications:  (Not in a hospital admission)  OB/GYN Status:  No LMP for male patient.  General Assessment Data [facility] of Assessment: [facility] TTS Assessment: In system Is this a Tele or Face-to-Face Assessment?: Face-to-Face Is this an Initial Assessment or a Re-assessment for this encounter?: Initial Assessment Marital status: Single Living Arrangements: Parent Can pt return to current living arrangement?: Yes Admission Status: Involuntary Is patient capable of signing voluntary admission?: No(patient on IVC) Referral Source: Self/Family/Friend Insurance type: Biomedical scientist)     Crisis Care Plan Living Arrangements: Parent Legal Guardian: Other:(self) Name of Psychiatrist: (none) Name of Therapist: (none)  Education Status Is patient currently in school?: No Is the patient employed, unemployed or receiving disability?: Unemployed  Risk to self with the past 6 months Suicidal Ideation: No Has patient been a risk to self within the past 6 months prior to admission? : No Suicidal Intent: No Has patient had any suicidal intent within the past 6 months prior to admission? : No Is patient at risk for suicide?: No Suicidal Plan?: No Has patient had any suicidal plan within the past 6 months prior to admission? : No Access to Means: No What has been your use of drugs/alcohol within the last 12 months?: drinking daily Previous Attempts/Gestures: No How many times?: 0 Other Self Harm Risks: none Triggers for Past Attempts: None known Intentional Self Injurious Behavior: None Family Suicide History: No Recent stressful life event(s): Job Loss Persecutory voices/beliefs?: No Depression:  Yes Depression Symptoms: Isolating, Loss of interest in usual pleasures, Feeling angry/irritable Substance abuse history and/or treatment for substance abuse?: No Suicide prevention information given to non-admitted patients: Not applicable  Risk to Others within the past 6 months Homicidal Ideation: No Does patient have any lifetime risk of violence toward others beyond the six months prior to admission? : No Thoughts of Harm to Others: No Current Homicidal Intent: No Current Homicidal Plan: No Access to Homicidal Means: No Identified Victim: none History of harm to others?: No Assessment of Violence: None Noted Violent Behavior Description: none reported Does patient have access to weapons?: No Criminal Charges Pending?: No Does patient have a court date: No Is patient on probation?: No  Psychosis Hallucinations: None noted Delusions: None noted  Mental Status Report Appearance/Hygiene: Disheveled Eye Contact: Good Motor Activity: Freedom of movement Speech: Logical/coherent Level of Consciousness: Alert Mood: Depressed Affect: Depressed Anxiety Level: Moderate Thought Processes: Coherent, Relevant Judgement: Impaired Orientation: Person, Place, Time, Situation Obsessive Compulsive Thoughts/Behaviors: Moderate  Cognitive Functioning Concentration: Normal Memory: Recent Intact, Remote Intact Is patient IDD: No Is patient  DD?: No Insight: Fair Impulse Control: Poor Appetite: Fair Have you had any weight changes? : No Change Sleep: No Change Total Hours of Sleep: 8 Vegetative Symptoms: None, Decreased grooming  ADLScreening [facility] Assessment Services) Patient's cognitive ability adequate to safely complete daily activities?: Yes Patient able to express need for assistance with ADLs?: Yes Independently performs ADLs?: Yes (appropriate for developmental age)  Prior Inpatient Therapy Prior Inpatient Therapy: Yes Prior Therapy Dates: 2-3 years ago Prior Therapy  Facilty/Provider(s): [facility] Reason for Treatment: depression and ETOH  Prior Outpatient Therapy Prior Outpatient Therapy: No Does patient have an ACCT team?: No Does patient have Intensive In-House Services?  : No Does patient have [facility] services? : No Does patient have P4CC services?: No  ADL Screening (condition at time of admission) Patient's cognitive ability adequate to safely complete daily activities?: Yes Is the patient deaf or have difficulty hearing?: No Does the patient have difficulty seeing, even when wearing glasses/contacts?: No Does the patient have difficulty concentrating, remembering, or making decisions?: No Patient able to express need for assistance with ADLs?: Yes Does the patient have difficulty dressing or bathing?: No Independently performs ADLs?: Yes (appropriate for developmental age) Does the patient have difficulty walking or climbing stairs?: No Weakness of Legs: None Weakness of Arms/Hands: None     Therapy Consults (therapy consults require a physician order) PT Evaluation Needed: No OT Evalulation Needed: No SLP Evaluation Needed: No Abuse/Neglect Assessment (Assessment to be complete while patient is alone) Abuse/Neglect Assessment Can Be Completed: Yes Physical Abuse: Denies Verbal Abuse: Denies Sexual Abuse: Denies Exploitation of patient/patient's resources: Denies Self-Neglect: Denies Values / Beliefs Cultural Requests During Hospitalization: None Spiritual Requests During Hospitalization: None Consults Spiritual Care Consult Needed: No Social Work Consult Needed: No Merchant navy officer (For Healthcare) Does Patient Have a Medical Advance Directive?: No Would patient like information on creating a medical advance directive?: No - Patient declined Nutrition Screen- [facility] Adult/[facility] Has the patient recently lost weight without trying?: No Has the patient been eating poorly because of a decreased appetite?: No Malnutrition  Screening Tool Score: 0  Additional Information 1:1 In Past 12 Months?: No CIRT Risk: No Elopement Risk: No Does patient have medical clearance?: Yes     Disposition: Per Nanine Means, DNP, Patient will need to be monitored and observed overnight for safety and withdrawal potential and will be reassessed in the morning. Disposition Initial Assessment Completed for this Encounter: Yes Disposition of Patient: (Overnight observation due to IVC)  On Site Evaluation by:   Reviewed with Physician:    Arnoldo Lenis Najmo Pardue 05/14/2018 2:48 PM

## 2018-05-14 NOTE — ED Triage Notes (Signed)
Pt brought in by [employer], pt IVC, mom reports patient has not been taking medications and has been consuming alcohol. Pt states he saw psychiatrist and medication was recommended, but patient stated he didn't want to take it, he wanted his ADHD medication. Pt reports he drank throughout the day yesterday. Pt denies SI / Hi.

## 2018-05-14 NOTE — ED Notes (Signed)
Report received from. Patient alert and oriented, warm and dry, in no acute distress. Patient denies SI, HI, AVH, anxiety, depression and pain at this time. Patient made aware of Q15 minute rounds and security cameras for their safety. Patient instructed to come to desk with needs or concerns. Patient contracts for safety.

## 2018-05-14 NOTE — Patient Outreach (Signed)
ED Peer Support Specialist Patient Intake (Complete at intake & 30-60 Day Follow-up)  Name: Luis Acosta  MRN: [id number]  Age: 32 y.o.   Date of Admission: 05/14/2018  Intake: Initial Comments:      Primary Reason Admitted: Pt brought in by [employer], pt IVC, mom reports patient has not been taking medications and has been consuming alcohol. Pt states he saw psychiatrist and medication was recommended, but patient stated he didn't want to take it, he wanted his ADHD medication. Pt reports he drank throughout the day yesterday. Pt denies SI / Hi.  Lab values: Alcohol/ETOH: Positive Positive UDS? No Amphetamines: No Barbiturates: No Benzodiazepines: No Cocaine: No Opiates: No Cannabinoids: No  Demographic information: Gender: Male Ethnicity: African American Marital Status: Single Insurance Status: Facilities managerrivate Insurance Receives non-medical governmental assistance (Work Engineer, agriculturalirst/Welfare, Sales executivefood stamps, etc.: No Lives with: Parent Living situation: House/Apartment  Reported Patient History: Patient reported health conditions: None Patient aware of HIV and hepatitis status: No  In past year, has patient visited ED for any reason? No  Number of ED visits:    Reason(s) for visit:    In past year, has patient been hospitalized for any reason? No  Number of hospitalizations:    Reason(s) for hospitalization:    In past year, has patient been arrested? Yes  Number of arrests:    Reason(s) for arrest: DWI  In past year, has patient been incarcerated? No  Number of incarcerations:    Reason(s) for incarceration:    In past year, has patient received medication-assisted treatment? No  In past year, patient received the following treatments: Other (comment)  In past year, has patient received any harm reduction services? No  Did this include any of the following?    In past year, has patient received care from a mental health provider for diagnosis other than SUD? No  In past year,  is this first time patient has overdosed? No  Number of past overdoses:    In past year, is this first time patient has been hospitalized for an overdose? No  Number of hospitalizations for overdose(s):    Is patient currently receiving treatment for a mental health diagnosis? No  Patient reports experiencing difficulty participating in SUD treatment: No    Most important reason(s) for this difficulty?    Has patient received prior services for treatment? No  In past, patient has received services from following agencies:    Plan of Care:  Suggested follow up at these agencies/treatment centers: [facility] ([facility]stance [facility]), [facility] ([facility]), [facility](Pt stated he wants some outpatient information only. )  Other information: CPSS talked with Pt and was able to gain information as to what brought Pt in and what services Pt are seeking at the time. CPSS was made aware that Pt was picked up from his mothers place of residence and brought in. Pt stated that he knows he needs help with his drinking an he wants to receive some information as to AA classes and support groups.    Arlys JohnBrian Walton Digilio, CPSS  05/14/2018 3:39 PM

## 2018-05-14 NOTE — ED Notes (Signed)
Bed: [facility] Expected date:  Expected time:  Means of arrival:  Comments: IVC w/ [employer]

## 2018-05-14 NOTE — ED Provider Notes (Signed)
[facility] Emergency Department Provider Note MRN:  [id number]  Arrival date & time: 05/14/18     Chief Complaint   Medical Clearance; ivc; and medication non-compliance   History of Present Illness   Luis Acosta is a 32 y.o. year-old male with a history of alcohol abuse, bipolar disorder presenting to the ED with chief complaint of IVC.  Patient explains that he is been a heavy beer drinker for a long time.  This has bothered his mother, who is worried about him.  Has been living with mom and drinking heavily in the home.  Patient is unsure of any other reason why his mother would fill out IVC papers today.  Last alcoholic beverage this morning at 10 AM, does not feel any withdrawal symptoms currently.  Currently no complaints other than being here against as well.  Per report and IVC papers, patient has bipolar and is being noncompliant with his home [employer].  Is being combative, destructive of property at home.  Mother concerned for his safety and the safety of others.  Review of Systems  A complete 10 system review of systems was obtained and all systems are negative except as noted in the HPI and PMH.   Patient's Health History    Past Medical History:  Diagnosis Date  . ADHD   . Alcohol abuse   . Bipolar 1 disorder ([name])   . Depression   . Hyperlipidemia   . Hypertension     History reviewed. No pertinent surgical history.  History reviewed. No pertinent family history.  Social History   Socioeconomic History  . Marital status: Single    Spouse name: Not on file  . Number of children: Not on file  . Years of education: Not on file  . Highest education level: Not on file  Occupational History  . Not on file  Social Needs  . Financial resource strain: Not on file  . Food insecurity:    Worry: Not on file    Inability: Not on file  . Transportation needs:    Medical: Not on file    Non-medical: Not on file  Tobacco Use  . Smoking status:  Current Every Day Smoker  . Smokeless tobacco: Never Used  Substance and Sexual Activity  . Alcohol use: Yes  . Drug use: Not on file  . Sexual activity: Not on file  Lifestyle  . Physical activity:    Days per week: Not on file    Minutes per session: Not on file  . Stress: Not on file  Relationships  . Social connections:    Talks on phone: Not on file    Gets together: Not on file    Attends religious service: Not on file    Active member of club or organization: Not on file    Attends meetings of clubs or organizations: Not on file    Relationship status: Not on file  . Intimate partner violence:    Fear of current or ex partner: Not on file    Emotionally abused: Not on file    Physically abused: Not on file    Forced sexual activity: Not on file  Other Topics Concern  . Not on file  Social History Narrative  . Not on file     Physical Exam  Vital Signs and Nursing Notes reviewed Vitals:   05/14/18 1149 05/14/18 1346  BP: (!) 184/97 (!) 162/77  Pulse: (!) 120 (!) 101  Resp: 20  Temp: 98.4 F (36.9 C)   SpO2: 99%     CONSTITUTIONAL: Well-appearing, NAD NEURO:  Alert and oriented x 3, no focal deficits EYES:  eyes equal and reactive ENT/NECK:  no LAD, no JVD CARDIO: Tachycardic rate, well-perfused, normal S1 and S2 PULM:  CTAB no wheezing or rhonchi GI/GU:  normal bowel sounds, non-distended, non-tender MSK/SPINE:  No gross deformities, no edema SKIN:  no rash, atraumatic PSYCH:  Appropriate speech and behavior  Diagnostic and Interventional Summary    EKG Interpretation  Date/Time:  Sunday May 14 2018 13:50:59 EDT Ventricular Rate:  96 PR Interval:  154 QRS Duration: 92 QT Interval:  338 QTC Calculation: 427 R Axis:   59 Text Interpretation:  Normal sinus rhythm Normal ECG Confirmed by Kennis Carina [phone]) on 05/14/2018 2:15:01 PM      Labs Reviewed  COMPREHENSIVE METABOLIC PANEL - Abnormal; Notable for the following components:      Result  Value   CO2 21 (*)    Glucose, Bld 132 (*)    Total Protein 8.3 (*)    AST 157 (*)    ALT 223 (*)    All other components within normal limits  ETHANOL - Abnormal; Notable for the following components:   Alcohol, Ethyl (B) 65 (*)    All other components within normal limits  ACETAMINOPHEN LEVEL - Abnormal; Notable for the following components:   Acetaminophen (Tylenol), Serum <10 (*)    All other components within normal limits  CBC  RAPID URINE DRUG SCREEN, HOSP PERFORMED  SALICYLATE LEVEL    No orders to display    Medications  LORazepam (ATIVAN) injection 0-4 mg ( Intravenous See Alternative 05/14/18 1405)    Or  LORazepam (ATIVAN) tablet 0-4 mg (1 mg Oral Given 05/14/18 1405)  LORazepam (ATIVAN) injection 0-4 mg (has no administration in time range)    Or  LORazepam (ATIVAN) tablet 0-4 mg (has no administration in time range)  thiamine (VITAMIN B-1) tablet 100 mg (100 mg Oral Given 05/14/18 1404)    Or  thiamine (B-1) injection 100 mg ( Intravenous See Alternative 05/14/18 1404)  sodium chloride 0.9 % bolus 1,000 mL (1,000 mLs Intravenous New Bag/Given 05/14/18 1342)     Procedures Critical Care  ED Course and Medical Decision Making  I have reviewed the triage vital signs and the nursing notes.  Pertinent labs & imaging results that were available during my care of the patient were reviewed by me and considered in my medical decision making (see below for details). Clinical Course as of May 14 1454  Sun May 14, 2018  1241 Concern for aggressive, hostile behavior, potential harm to self or others, per mother, and this 32 year old male history of bipolar disorder, alcohol abuse.  Patient is calm, conversant here in the ED.  Heart rate 120, will monitor closely for signs of withdrawal, provide liter of fluid and reassess.  TTS to evaluate.  Labs pending to determine medical clearance.   [MB]  1355 Medically cleared, tachycardia resolved   [MB]  1355 Awaiting TTS formal recs     [MB]    Clinical Course User Index [MB] Sabas Sous, MD    Patient will be kept overnight for psychiatry evaluation in the morning.  Elmer Sow. Pilar Plate, MD [facility] Emergency Medicine [facility] [email or web address]  Final Clinical Impressions(s) / ED Diagnoses     ICD-10-CM   1. Alcohol abuse F10.10   2. Aggressive behavior R46.89   3. Bipolar 1 disorder (  [name]) F31.9   4. Non compliance w medication regimen Z91.14     ED Discharge Orders    None         Sabas SousBero, Stepfanie Yott M, MD 05/14/18 1455

## 2018-05-15 DIAGNOSIS — F3161 Bipolar disorder, current episode mixed, mild: Secondary | ICD-10-CM

## 2018-05-15 DIAGNOSIS — F10280 Alcohol dependence with alcohol-induced anxiety disorder: Secondary | ICD-10-CM | POA: Diagnosis present

## 2018-05-15 MED ORDER — GABAPENTIN 300 MG PO CAPS
300.0000 mg | ORAL_CAPSULE | Freq: Three times a day (TID) | ORAL | Status: DC
  Administered 2018-05-15: 300 mg via ORAL
  Filled 2018-05-15: qty 1

## 2018-05-15 MED ORDER — GABAPENTIN 300 MG PO CAPS: 300.0000 mg | ORAL_CAPSULE | Freq: Three times a day (TID) | ORAL | 0 refills | Status: DC

## 2018-05-15 MED ORDER — CARBAMAZEPINE ER 200 MG PO TB12
200.0000 mg | ORAL_TABLET | Freq: Two times a day (BID) | ORAL | Status: DC
  Administered 2018-05-15: 200 mg via ORAL
  Filled 2018-05-15: qty 1

## 2018-05-15 MED ORDER — CARBAMAZEPINE ER 200 MG PO TB12: 200.0000 mg | ORAL_TABLET | Freq: Two times a day (BID) | ORAL | 0 refills | Status: DC

## 2018-05-15 NOTE — Consult Note (Addendum)
[facility] Psych ED Discharge  05/15/2018 11:34 AM Neils Siracusa  MRN:  [id number] Principal Problem: Alcohol dependence with alcohol-induced anxiety disorder [facility]) Discharge Diagnoses:  Patient Active Problem List   Diagnosis Date Noted  . Alcohol dependence with alcohol-induced anxiety disorder ([name]) [F10.280] 05/15/2018    Priority: High  . Bipolar affective disorder, current episode mixed ([name]) [F31.60] 06/18/2016    Priority: High    Subjective: 32 yo male who presented to the ED via his mother for alcohol abuse after a verbal altercation.  He has been drinking for years but recently it has increased due to stress, better with calm environment.  On assessment, he denies suicidal/homicidal ideations, hallucinations, or withdrawal symptoms.  He is frustrated because he did try AA but continued to drink.  Peer support consult placed and alcohol detox/rehab being explored, stable to discharge.  [facility] contacted by the patient.    Total Time spent with patient: 45 minutes  Past Psychiatric History: bipolar disorder, alcohol abuse  Past Medical History:  Past Medical History:  Diagnosis Date  . ADHD   . Alcohol abuse   . Bipolar 1 disorder ([name])   . Depression   . Hyperlipidemia   . Hypertension    History reviewed. No pertinent surgical history. Family History: History reviewed. No pertinent family history. Family Psychiatric  History: none Social History:  Social History   Substance and Sexual Activity  Alcohol Use Yes    Social History   Substance and Sexual Activity  Drug Use Not on file   Social History   Socioeconomic History  . Marital status: Single    Spouse name: Not on file  . Number of children: Not on file  . Years of education: Not on file  . Highest education level: Not on file  Occupational History  . Not on file  Social Needs  . Financial resource strain: Not on file  . Food insecurity:    Worry: Not on file    Inability: Not on file  .  Transportation needs:    Medical: Not on file    Non-medical: Not on file  Tobacco Use  . Smoking status: Current Every Day Smoker  . Smokeless tobacco: Never Used  Substance and Sexual Activity  . Alcohol use: Yes  . Drug use: Not on file  . Sexual activity: Not on file  Lifestyle  . Physical activity:    Days per week: Not on file    Minutes per session: Not on file  . Stress: Not on file  Relationships  . Social connections:    Talks on phone: Not on file    Gets together: Not on file    Attends religious service: Not on file    Active member of club or organization: Not on file    Attends meetings of clubs or organizations: Not on file    Relationship status: Not on file  Other Topics Concern  . Not on file  Social History Narrative  . Not on file    Has this patient used any form of tobacco in the last 30 days? (Cigarettes, Smokeless Tobacco, Cigars, and/or Pipes) N/A  Current Medications: Current Facility-Administered Medications  Medication Dose Route Frequency Provider Last Rate Last Dose  . carbamazepine (TEGRETOL XR) 12 hr tablet 200 mg  200 mg Oral BID Charm Rings, NP      . gabapentin (NEURONTIN) capsule 300 mg  300 mg Oral TID Charm Rings, NP      .  LORazepam (ATIVAN) injection 0-4 mg  0-4 mg Intravenous Q6H Sabas Sous, MD       Or  . LORazepam (ATIVAN) tablet 0-4 mg  0-4 mg Oral Q6H Sabas Sous, MD   1 mg at 05/14/18 1756  . [START ON 05/16/2018] LORazepam (ATIVAN) injection 0-4 mg  0-4 mg Intravenous Q12H Sabas Sous, MD       Or  . Melene Muller ON 05/16/2018] LORazepam (ATIVAN) tablet 0-4 mg  0-4 mg Oral Q12H Sabas Sous, MD      . thiamine (VITAMIN B-1) tablet 100 mg  100 mg Oral Daily Sabas Sous, MD   100 mg at 05/15/18 4098   Or  . thiamine (B-1) injection 100 mg  100 mg Intravenous Daily Sabas Sous, MD       Current Outpatient Medications  Medication Sig Dispense Refill  . buPROPion (WELLBUTRIN XL) 300 MG 24 hr tablet Take  300 mg by mouth daily.    Marland Kitchen lisdexamfetamine (VYVANSE) 60 MG capsule Take 60 mg by mouth daily.     PTA Medications:  (Not in a hospital admission)  Musculoskeletal: Strength & Muscle Tone: within normal limits Gait & Station: normal Patient leans: N/A  Psychiatric Specialty Exam: Physical Exam  Nursing note and vitals reviewed. Constitutional: He is oriented to person, place, and time. He appears well-developed and well-nourished.  HENT:  Head: Normocephalic and atraumatic.  Neck: Normal range of motion.  Respiratory: Effort normal.  Musculoskeletal: Normal range of motion.  Neurological: He is alert and oriented to person, place, and time.  Psychiatric: His speech is normal and behavior is normal. Judgment and thought content normal. His mood appears anxious. Cognition and memory are normal.    Review of Systems  Psychiatric/Behavioral: Positive for substance abuse. The patient is nervous/anxious.   All other systems reviewed and are negative.   Blood pressure 133/86, pulse 78, temperature 98.3 F (36.8 C), temperature source Oral, resp. rate 16, SpO2 100 %.There is no height or weight on file to calculate BMI.  General Appearance: Casual  Eye Contact:  Good  Speech:  Normal Rate  Volume:  Normal  Mood:  Anxious  Affect:  Congruent  Thought Process:  Coherent and Descriptions of Associations: Intact  Orientation:  Full (Time, Place, and Person)  Thought Content:  WDL and Logical  Suicidal Thoughts:  No  Homicidal Thoughts:  No  Memory:  Immediate;   Good Recent;   Good Remote;   Good  Judgement:  Fair  Insight:  Fair  Psychomotor Activity:  Normal  Concentration:  Concentration: Good and Attention Span: Good  Recall:  Good  Fund of Knowledge:  Fair  Language:  Good  Akathisia:  No  Handed:  Right  AIMS (if indicated):   N/A  Assets:  Leisure Time Resilience Social Support  ADL's:  Intact  Cognition:  WNL  Sleep:   N/A     Demographic Factors:  Male  and Caucasian  Loss Factors: NA  Historical Factors: NA  Risk Reduction Factors:   Sense of responsibility to family, Living with another person, especially a relative and Positive social support  Continued Clinical Symptoms:  Anxiety, mild  Cognitive Features That Contribute To Risk:  None    Suicide Risk:  Minimal: No identifiable suicidal ideation.  Patients presenting with no risk factors but with morbid ruminations; may be classified as minimal risk based on the severity of the depressive symptoms    Plan Of Care/Follow-up recommendations:  Activity:  as tolerated Diet:  heart healthy diet  Disposition: discharge home Nanine MeansLORD, JAMISON, NP 05/15/2018, 11:34 AM   Patient seen face-to-face for psychiatric evaluation, chart reviewed and case discussed with the physician extender and developed treatment plan. Reviewed the information documented and agree with the treatment plan.  Juanetta BeetsJacqueline Calil Amor, DO 05/15/18 3:43 PM

## 2018-05-15 NOTE — Patient Outreach (Signed)
CPSS met with the patient to provide substance use recovery support and help with recovery resources. CPSS Aaron Edelman DeGraphenreid met with the patient yesterday 05/15/18 and provided him information for outpatient substance use resources. CPSS plans to get connected with [facility] residential substance use treatment program. Patient has already been in contact with [facility] intake and has family support with his mom in helping him get connected to their residential susbtance use treatment. CPSS provided and explained information regarding other residential substance use treatment centers across [place]. CPSS also provided CPSS contact information and an AA meeting list. CPSS strongly encouraged the patient to contact Aaron Edelman DeGraphenreid CPSS or Mason Jim CPSS for further help with getting connected to a residential substance use treatment center after discharge from the [facility].

## 2018-05-15 NOTE — Discharge Instructions (Signed)
To help you maintain a sober lifestyle, a substance abuse treatment program may be beneficial to you.  Contact [facility] at your earliest opportunity to ask about enrolling in their program: ° °     [facility] °     [address] °     [place], [place] [address] °     [phone] °

## 2018-05-15 NOTE — ED Notes (Signed)
Talking on hallway phone.

## 2018-05-15 NOTE — BH Assessment (Signed)
[facility]  Per Juanetta BeetsJacqueline Norman, DO, this pt does not require psychiatric hospitalization at this time.  Pt presents under IVC initiated by pt's mother, which Dr Sharma CovertNorman has rescinded.  [facility]  Discharge instructions advise pt to follow through with this plan.  Pt would also benefit from seeing Peer Support Specialists; they will be asked to speak to pt.  Pt's nurse, Morrie Sheldonshley, has been notified.  Doylene Canninghomas Tearsa Kowalewski, MA Triage Specialist [phone]

## 2018-05-15 NOTE — ED Notes (Signed)
Pt d/c home per MD order. Discharge summary reviewed with pt. RX provided. Pt denies SI/HI/AVH. Personal property returned. Pt signed e-signature. Ambulatory off unit. Pt mother in lobby for discharge.

## 2018-09-11 ENCOUNTER — Emergency Department (HOSPITAL_COMMUNITY)
Admission: EM | Admit: 2018-09-11 | Discharge: 2018-09-12 | Discharge status: Against Medical Advice | Payer: BLUE CROSS/BLUE SHIELD | Attending: Emergency Medicine | Admitting: Emergency Medicine

## 2018-09-11 DIAGNOSIS — R42 Dizziness and giddiness: Secondary | ICD-10-CM | POA: Diagnosis not present

## 2018-09-11 DIAGNOSIS — R079 Chest pain, unspecified: Secondary | ICD-10-CM | POA: Insufficient documentation

## 2018-09-11 DIAGNOSIS — R110 Nausea: Secondary | ICD-10-CM | POA: Insufficient documentation

## 2018-09-11 DIAGNOSIS — Z5321 Procedure and treatment not carried out due to patient leaving prior to being seen by health care provider: Secondary | ICD-10-CM | POA: Insufficient documentation

## 2018-09-12 ENCOUNTER — Other Ambulatory Visit: Payer: Self-pay

## 2018-09-12 ENCOUNTER — Encounter (HOSPITAL_COMMUNITY): Payer: Self-pay | Admitting: Emergency Medicine

## 2018-09-12 ENCOUNTER — Emergency Department (HOSPITAL_COMMUNITY): Payer: BLUE CROSS/BLUE SHIELD

## 2018-09-12 LAB — BASIC METABOLIC PANEL
Anion gap: 13 (ref 5–15)
BUN: 9 mg/dL (ref 6–20)
CO2: 24 mmol/L (ref 22–32)
Calcium: 9 mg/dL (ref 8.9–10.3)
Chloride: 100 mmol/L (ref 98–111)
Creatinine, Ser: 0.94 mg/dL (ref 0.61–1.24)
GFR calc Af Amer: >60 mL/min (ref >60)
GFR calc non Af Amer: >60 mL/min (ref >60)
Glucose, Bld: 131 mg/dL — ABNORMAL HIGH (ref 70–99)
Potassium: 3 mmol/L — ABNORMAL LOW (ref 3.5–5.1)
Sodium: 137 mmol/L (ref 135–145)

## 2018-09-12 LAB — CBC
HCT: 42.7 % (ref 39.0–52.0)
HEMOGLOBIN: 14.4 g/dL (ref 13.0–17.0)
MCH: 30.1 pg (ref 26.0–34.0)
MCHC: 33.7 g/dL (ref 30.0–36.0)
MCV: 89.3 fL (ref 80.0–100.0)
Platelets: 384 10*3/uL (ref 150–400)
RBC: 4.78 MIL/uL (ref 4.22–5.81)
RDW: 14.2 % (ref 11.5–15.5)
WBC: 10.9 10*3/uL — ABNORMAL HIGH (ref 4.0–10.5)
nRBC: 0 % (ref 0.0–0.2)

## 2018-09-12 LAB — I-STAT TROPONIN, ED: Troponin i, poc: 0 ng/mL (ref 0.00–0.08)

## 2018-09-12 NOTE — ED Notes (Signed)
Pt states that he is feeling better and will follow up with a doctor in the morning.

## 2018-09-12 NOTE — ED Triage Notes (Signed)
Pt arrived stating he was having a heart attack.  Initially stated he was having chest pain but now states he never really had chest pain.  Pt anxious.  Reports feeling like he was going to pass out, difficulty catching breath, nausea, dizziness, lightheadedness, and diaphoretic prior to arrival.  States he was scared but feels better now.  Reports heavy ETOH use today- last drink 7 hours ago.  Reports last used crack 3 days ago.

## 2019-03-01 ENCOUNTER — Emergency Department (HOSPITAL_COMMUNITY)
Admission: EM | Admit: 2019-03-01 | Discharge: 2019-03-01 | Discharge status: Against Medical Advice | Payer: BLUE CROSS/BLUE SHIELD

## 2019-03-01 ENCOUNTER — Emergency Department (HOSPITAL_COMMUNITY)
Admission: EM | Admit: 2019-03-01 | Discharge: 2019-03-01 | Disposition: A | Discharge status: Home / Self Care | Payer: BLUE CROSS/BLUE SHIELD

## 2019-03-01 NOTE — ED Notes (Addendum)
Trying to triage patient and patient is cussing because nurse asked him to sit down. Security asked patient to stop cussing. Patient states he wants to go to another hospital. Patient left.

## 2019-03-01 NOTE — ED Notes (Signed)
Pt checked in and was extremely belligerent. Pt's mother asked how long the wait was and this RN explained that the person waiting the longest has been here for 6 hours but the wait is based upon the pt's chief complaint, the triage nurses findings, and the amount of EMS traffic that comes in. Pt stormed out and his mother followed him.

## 2019-08-29 IMAGING — DX DG CHEST 2V
2 series · 2 of 2 positions shown · non-contrast
Comparison: None.

CLINICAL DATA: Chest pain

EXAM:
CHEST - 2 VIEW

[w chest pa]
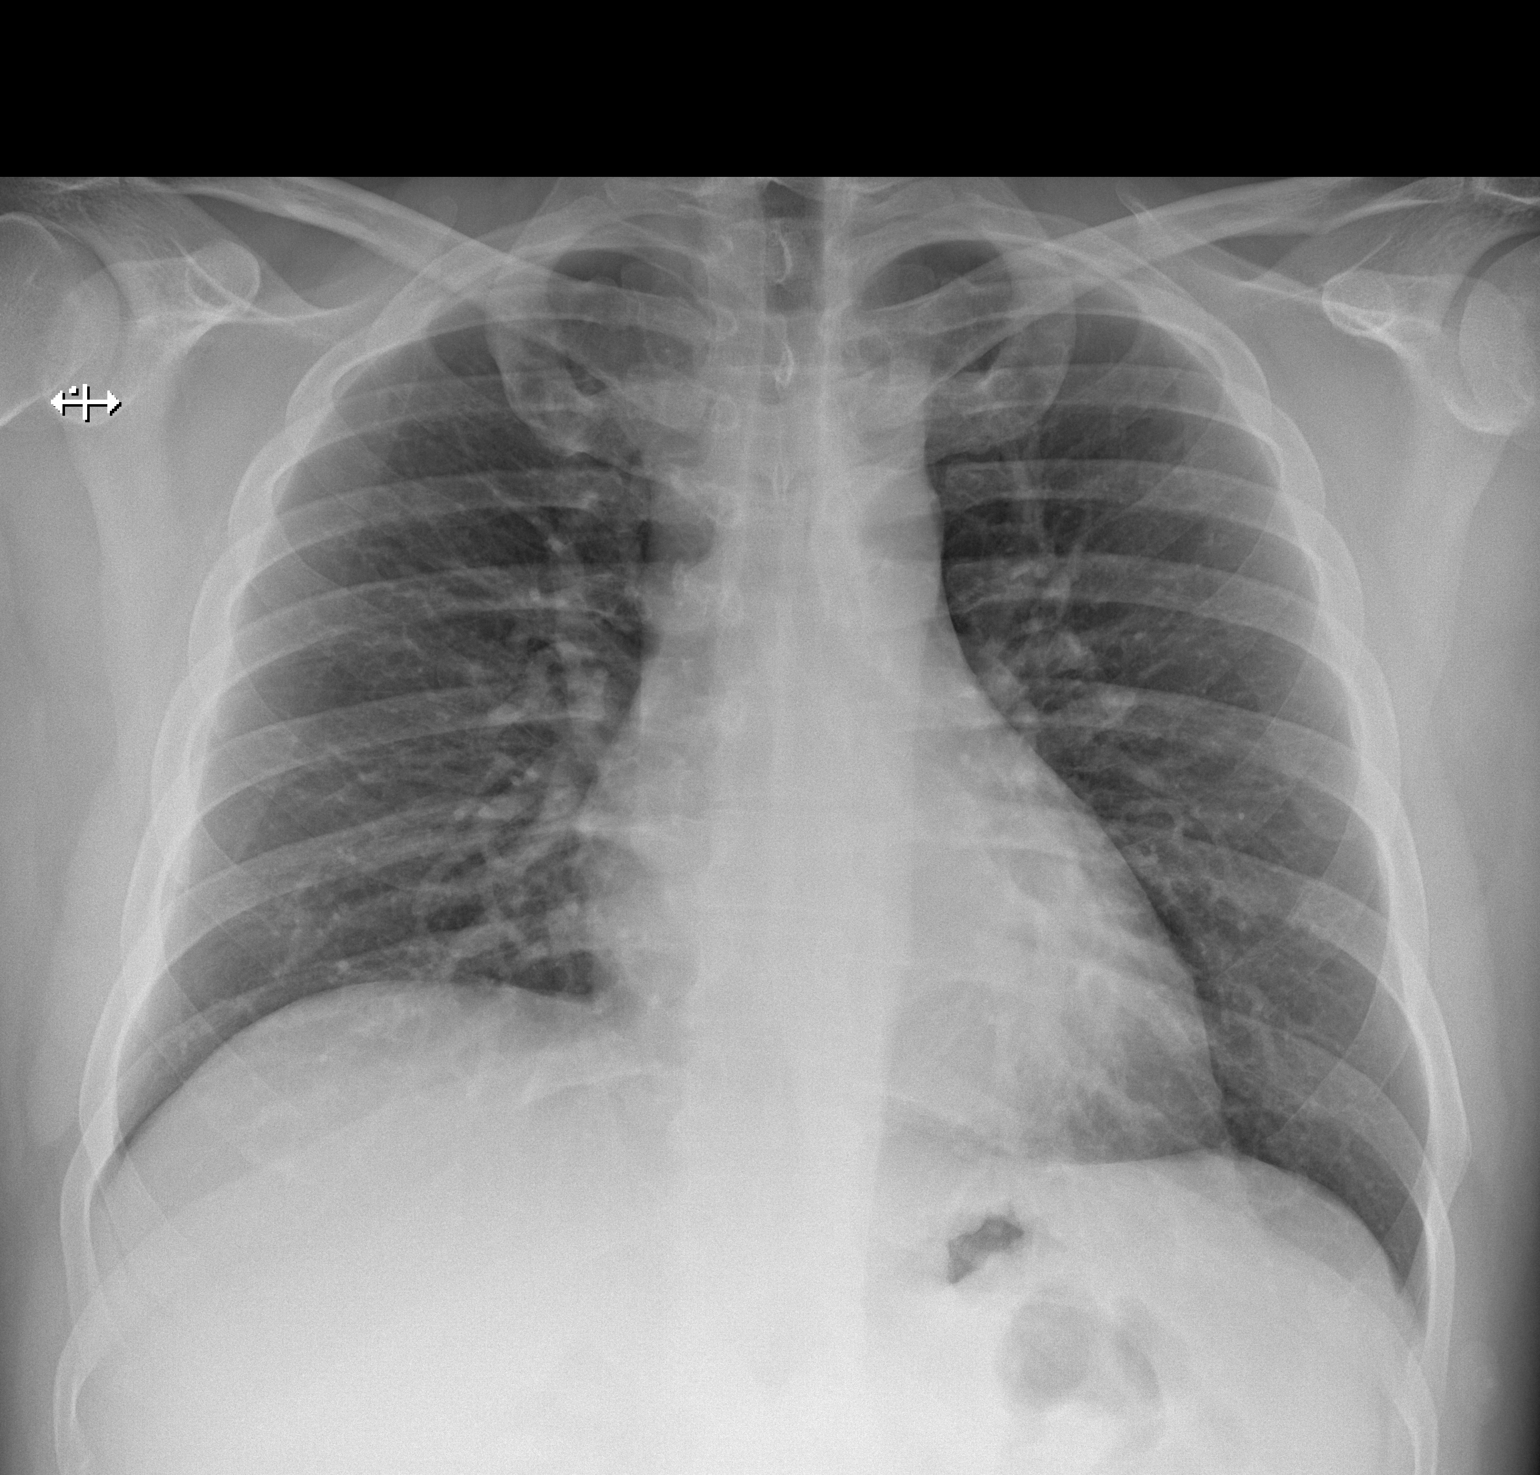

[w chest lat]
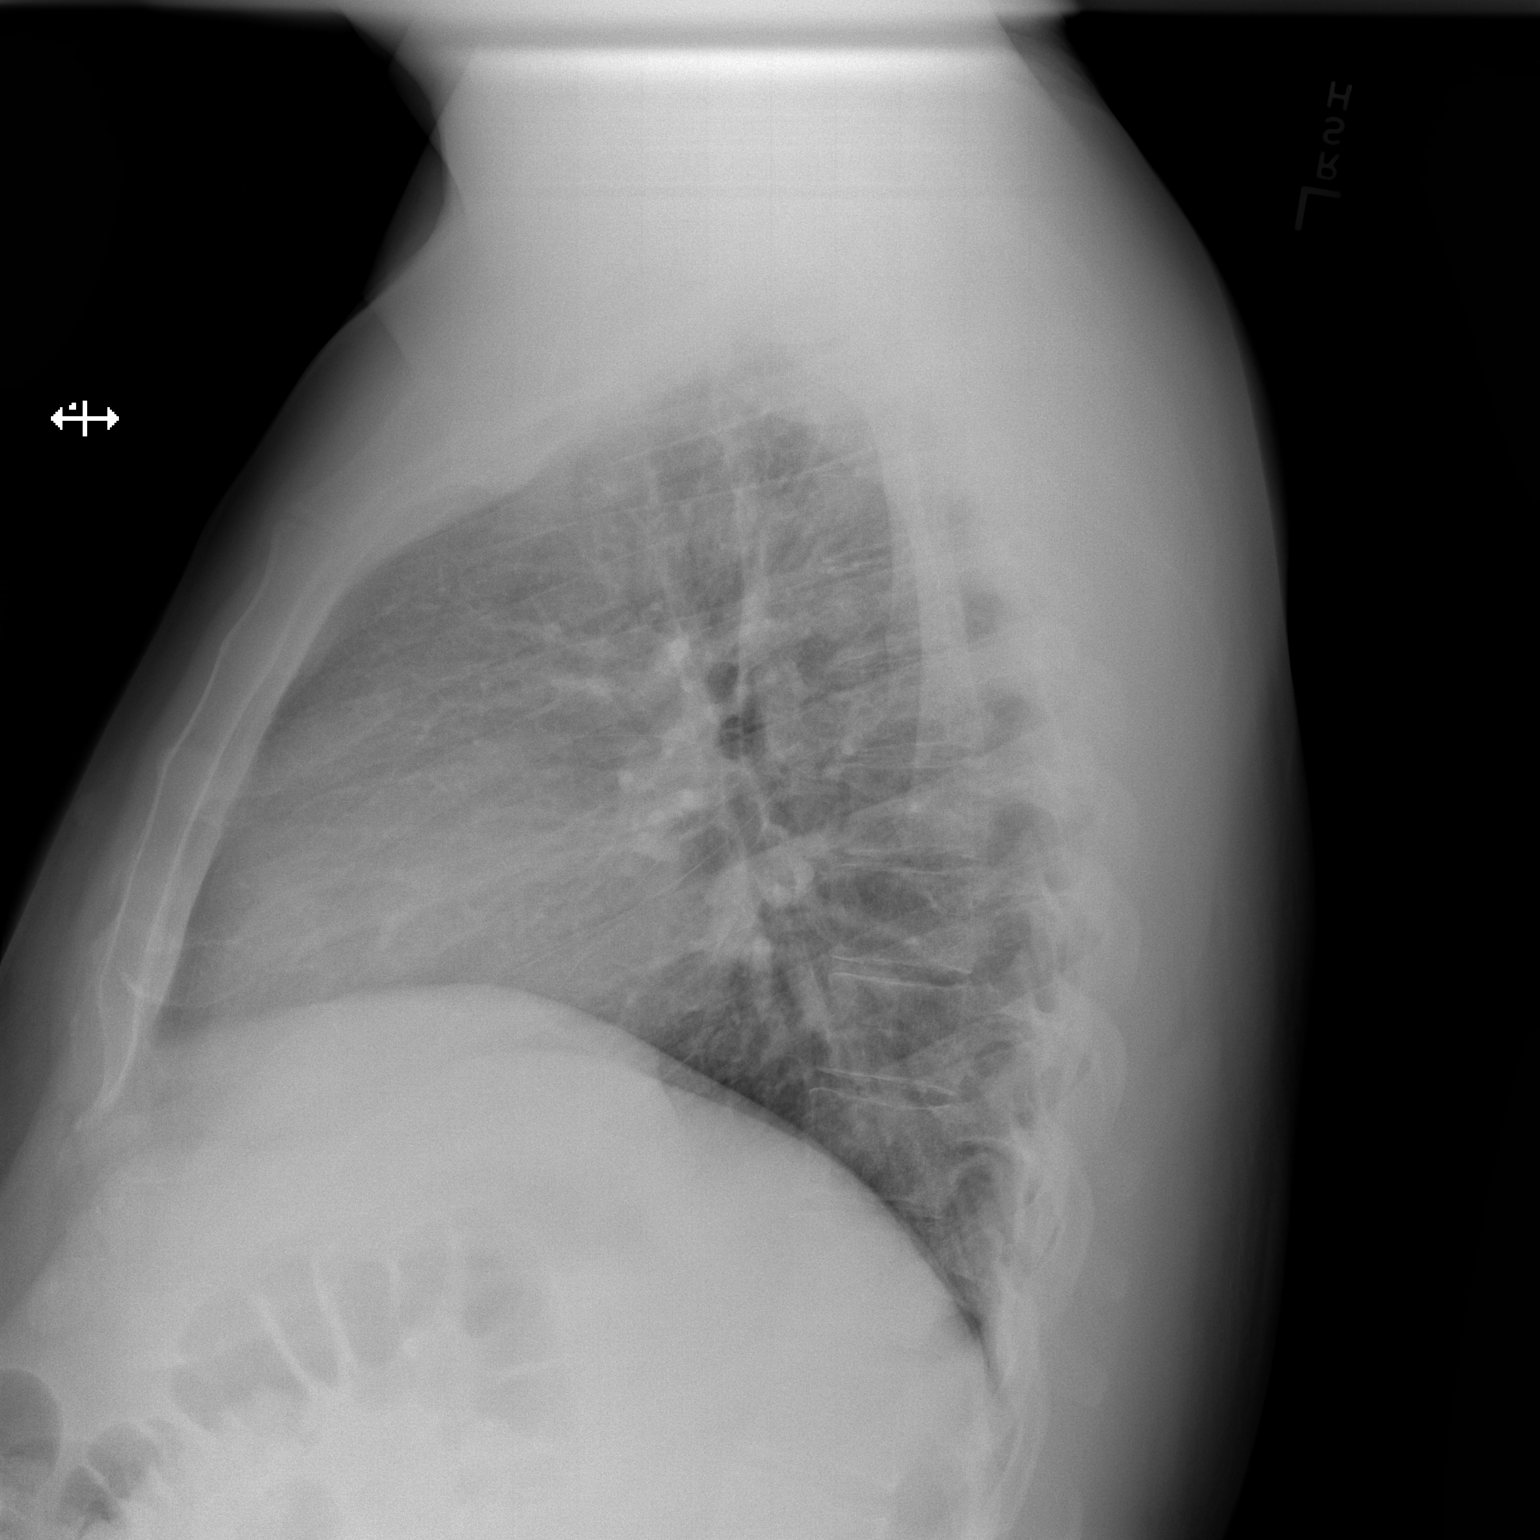

[2 of 2 positions shown; findings below may reference images not displayed]

FINDINGS: The heart size and mediastinal contours are within normal limits.
Both lungs are clear. The visualized skeletal structures are
unremarkable.
IMPRESSION: No active cardiopulmonary disease.

## 2019-09-23 ENCOUNTER — Inpatient Hospital Stay (HOSPITAL_COMMUNITY)
Admission: AD | Admit: 2019-09-23 | Discharge: 2019-09-28 | DRG: 885 | Disposition: A | Discharge status: Home / Self Care | Payer: BC Managed Care – PPO | Attending: Psychiatry | Admitting: Psychiatry

## 2019-09-23 DIAGNOSIS — R451 Restlessness and agitation: Secondary | ICD-10-CM | POA: Diagnosis present

## 2019-09-23 DIAGNOSIS — E785 Hyperlipidemia, unspecified: Secondary | ICD-10-CM | POA: Diagnosis present

## 2019-09-23 DIAGNOSIS — Z818 Family history of other mental and behavioral disorders: Secondary | ICD-10-CM

## 2019-09-23 DIAGNOSIS — F17200 Nicotine dependence, unspecified, uncomplicated: Secondary | ICD-10-CM | POA: Diagnosis present

## 2019-09-23 DIAGNOSIS — F1020 Alcohol dependence, uncomplicated: Secondary | ICD-10-CM

## 2019-09-23 DIAGNOSIS — F419 Anxiety disorder, unspecified: Secondary | ICD-10-CM | POA: Diagnosis present

## 2019-09-23 DIAGNOSIS — F10229 Alcohol dependence with intoxication, unspecified: Secondary | ICD-10-CM | POA: Diagnosis present

## 2019-09-23 DIAGNOSIS — G4700 Insomnia, unspecified: Secondary | ICD-10-CM | POA: Diagnosis present

## 2019-09-23 DIAGNOSIS — Z9114 Patient's other noncompliance with medication regimen: Secondary | ICD-10-CM

## 2019-09-23 DIAGNOSIS — I10 Essential (primary) hypertension: Secondary | ICD-10-CM | POA: Diagnosis present

## 2019-09-23 DIAGNOSIS — R4587 Impulsiveness: Secondary | ICD-10-CM | POA: Diagnosis present

## 2019-09-23 DIAGNOSIS — Z590 Homelessness: Secondary | ICD-10-CM

## 2019-09-23 DIAGNOSIS — F1094 Alcohol use, unspecified with alcohol-induced mood disorder: Secondary | ICD-10-CM | POA: Insufficient documentation

## 2019-09-23 DIAGNOSIS — F909 Attention-deficit hyperactivity disorder, unspecified type: Secondary | ICD-10-CM | POA: Diagnosis present

## 2019-09-23 DIAGNOSIS — F3181 Bipolar II disorder: Secondary | ICD-10-CM | POA: Diagnosis not present

## 2019-09-23 DIAGNOSIS — Z560 Unemployment, unspecified: Secondary | ICD-10-CM

## 2019-09-23 DIAGNOSIS — Z20822 Contact with and (suspected) exposure to covid-19: Secondary | ICD-10-CM | POA: Diagnosis present

## 2019-09-23 DIAGNOSIS — Z79899 Other long term (current) drug therapy: Secondary | ICD-10-CM

## 2019-09-23 LAB — RESPIRATORY PANEL BY RT PCR (FLU A&B, COVID)
Influenza A by PCR: NEGATIVE
Influenza B by PCR: NEGATIVE
SARS Coronavirus 2 by RT PCR: NEGATIVE

## 2019-09-23 MED ORDER — HYDROXYZINE HCL 25 MG PO TABS
25.0000 mg | ORAL_TABLET | Freq: Three times a day (TID) | ORAL | Status: DC | PRN
  Filled 2019-09-23: qty 1

## 2019-09-23 MED ORDER — ADULT MULTIVITAMIN W/MINERALS CH
1.0000 | ORAL_TABLET | Freq: Every day | ORAL | Status: DC
  Administered 2019-09-24 – 2019-09-28 (×5): 1 via ORAL
  Filled 2019-09-23 (×7): qty 1

## 2019-09-23 MED ORDER — LORAZEPAM 1 MG PO TABS
1.0000 mg | ORAL_TABLET | Freq: Four times a day (QID) | ORAL | Status: AC | PRN
  Filled 2019-09-23: qty 1

## 2019-09-23 MED ORDER — ALUM & MAG HYDROXIDE-SIMETH 200-200-20 MG/5ML PO SUSP: 30.0000 mL | ORAL | Status: DC | PRN

## 2019-09-23 MED ORDER — LOPERAMIDE HCL 2 MG PO CAPS: 2.0000 mg | ORAL_CAPSULE | ORAL | Status: AC | PRN

## 2019-09-23 MED ORDER — LORAZEPAM 1 MG PO TABS: 1.0000 mg | ORAL_TABLET | Freq: Two times a day (BID) | ORAL | Status: DC

## 2019-09-23 MED ORDER — LORAZEPAM 1 MG PO TABS: 1.0000 mg | ORAL_TABLET | Freq: Every day | ORAL | Status: DC

## 2019-09-23 MED ORDER — MAGNESIUM HYDROXIDE 400 MG/5ML PO SUSP
30.0000 mL | Freq: Every day | ORAL | Status: DC | PRN
  Administered 2019-09-26: 09:00:00 30 mL via ORAL

## 2019-09-23 MED ORDER — ACETAMINOPHEN 325 MG PO TABS: 650.0000 mg | ORAL_TABLET | Freq: Four times a day (QID) | ORAL | Status: DC | PRN

## 2019-09-23 MED ORDER — TRAZODONE HCL 50 MG PO TABS
50.0000 mg | ORAL_TABLET | Freq: Every evening | ORAL | Status: DC | PRN
  Filled 2019-09-23: qty 1

## 2019-09-23 MED ORDER — THIAMINE HCL 100 MG PO TABS
100.0000 mg | ORAL_TABLET | Freq: Every day | ORAL | Status: DC
  Administered 2019-09-24 – 2019-09-28 (×5): 100 mg via ORAL
  Filled 2019-09-23 (×7): qty 1

## 2019-09-23 MED ORDER — ONDANSETRON 4 MG PO TBDP: 4.0000 mg | ORAL_TABLET | Freq: Four times a day (QID) | ORAL | Status: AC | PRN

## 2019-09-23 MED ORDER — LORAZEPAM 1 MG PO TABS: 1.0000 mg | ORAL_TABLET | Freq: Three times a day (TID) | ORAL | Status: DC

## 2019-09-23 MED ORDER — HYDROCHLOROTHIAZIDE 25 MG PO TABS
25.0000 mg | ORAL_TABLET | Freq: Every day | ORAL | Status: DC
  Administered 2019-09-24 – 2019-09-28 (×5): 25 mg via ORAL
  Filled 2019-09-23 (×7): qty 1

## 2019-09-23 MED ORDER — LORAZEPAM 1 MG PO TABS
1.0000 mg | ORAL_TABLET | Freq: Four times a day (QID) | ORAL | Status: DC
  Filled 2019-09-23: qty 1
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
## 2019-09-24 ENCOUNTER — Encounter (HOSPITAL_COMMUNITY): Payer: Self-pay | Admitting: Nurse Practitioner

## 2019-09-24 ENCOUNTER — Other Ambulatory Visit: Payer: Self-pay

## 2019-09-24 DIAGNOSIS — Z9114 Patient's other noncompliance with medication regimen: Secondary | ICD-10-CM | POA: Diagnosis not present

## 2019-09-24 DIAGNOSIS — F3181 Bipolar II disorder: Secondary | ICD-10-CM | POA: Diagnosis not present

## 2019-09-24 DIAGNOSIS — F1020 Alcohol dependence, uncomplicated: Secondary | ICD-10-CM | POA: Diagnosis not present

## 2019-09-24 DIAGNOSIS — F909 Attention-deficit hyperactivity disorder, unspecified type: Secondary | ICD-10-CM | POA: Diagnosis not present

## 2019-09-24 DIAGNOSIS — Z590 Homelessness: Secondary | ICD-10-CM | POA: Diagnosis not present

## 2019-09-24 DIAGNOSIS — F10229 Alcohol dependence with intoxication, unspecified: Secondary | ICD-10-CM | POA: Diagnosis not present

## 2019-09-24 DIAGNOSIS — G4700 Insomnia, unspecified: Secondary | ICD-10-CM | POA: Diagnosis not present

## 2019-09-24 DIAGNOSIS — E785 Hyperlipidemia, unspecified: Secondary | ICD-10-CM | POA: Diagnosis not present

## 2019-09-24 DIAGNOSIS — F17200 Nicotine dependence, unspecified, uncomplicated: Secondary | ICD-10-CM | POA: Diagnosis not present

## 2019-09-24 DIAGNOSIS — F419 Anxiety disorder, unspecified: Secondary | ICD-10-CM | POA: Diagnosis not present

## 2019-09-24 DIAGNOSIS — R4587 Impulsiveness: Secondary | ICD-10-CM | POA: Diagnosis not present

## 2019-09-24 DIAGNOSIS — Z20822 Contact with and (suspected) exposure to covid-19: Secondary | ICD-10-CM | POA: Diagnosis not present

## 2019-09-24 DIAGNOSIS — Z79899 Other long term (current) drug therapy: Secondary | ICD-10-CM | POA: Diagnosis not present

## 2019-09-24 DIAGNOSIS — R451 Restlessness and agitation: Secondary | ICD-10-CM | POA: Diagnosis not present

## 2019-09-24 DIAGNOSIS — I10 Essential (primary) hypertension: Secondary | ICD-10-CM | POA: Diagnosis not present

## 2019-09-24 DIAGNOSIS — Z560 Unemployment, unspecified: Secondary | ICD-10-CM | POA: Diagnosis not present

## 2019-09-24 DIAGNOSIS — Z818 Family history of other mental and behavioral disorders: Secondary | ICD-10-CM | POA: Diagnosis not present

## 2019-09-24 LAB — RAPID URINE DRUG SCREEN, HOSP PERFORMED
Amphetamines: NOT DETECTED
Barbiturates: NOT DETECTED
Benzodiazepines: NOT DETECTED
Cocaine: NOT DETECTED
Opiates: NOT DETECTED
Tetrahydrocannabinol: NOT DETECTED

## 2019-09-24 LAB — COMPREHENSIVE METABOLIC PANEL
ALT: 102 U/L — ABNORMAL HIGH (ref 0–44)
AST: 46 U/L — ABNORMAL HIGH (ref 15–41)
Albumin: 4.7 g/dL (ref 3.5–5.0)
Alkaline Phosphatase: 67 U/L (ref 38–126)
Anion gap: 14 (ref 5–15)
BUN: 9 mg/dL (ref 6–20)
CO2: 23 mmol/L (ref 22–32)
Calcium: 9.2 mg/dL (ref 8.9–10.3)
Chloride: 102 mmol/L (ref 98–111)
Creatinine, Ser: 0.79 mg/dL (ref 0.61–1.24)
GFR calc Af Amer: >60 mL/min (ref >60)
GFR calc non Af Amer: >60 mL/min (ref >60)
Glucose, Bld: 102 mg/dL — ABNORMAL HIGH (ref 70–99)
Potassium: 3.4 mmol/L — ABNORMAL LOW (ref 3.5–5.1)
Sodium: 139 mmol/L (ref 135–145)
Total Bilirubin: 0.7 mg/dL (ref 0.3–1.2)
Total Protein: 7.6 g/dL (ref 6.5–8.1)

## 2019-09-24 LAB — CBC
HCT: 45.4 % (ref 39.0–52.0)
Hemoglobin: 15.3 g/dL (ref 13.0–17.0)
MCH: 31.5 pg (ref 26.0–34.0)
MCHC: 33.7 g/dL (ref 30.0–36.0)
MCV: 93.6 fL (ref 80.0–100.0)
Platelets: 318 10*3/uL (ref 150–400)
RBC: 4.85 MIL/uL (ref 4.22–5.81)
RDW: 12.9 % (ref 11.5–15.5)
WBC: 7.6 10*3/uL (ref 4.0–10.5)
nRBC: 0 % (ref 0.0–0.2)

## 2019-09-24 LAB — ETHANOL: Alcohol, Ethyl (B): 16 mg/dL — ABNORMAL HIGH (ref <10)

## 2019-09-24 MED ORDER — CLONIDINE HCL ER 0.1 MG PO TB12
0.1000 mg | ORAL_TABLET | Freq: Once | ORAL | Status: AC
  Administered 2019-09-24: 01:00:00 0.1 mg via ORAL
  Filled 2019-09-24 (×2): qty 1

## 2019-09-24 NOTE — Plan of Care (Signed)
[facility] Observation Crisis Plan  Reason for Crisis Plan:  Substance Abuse   Plan of Care:  Referral for Substance Abuse  Family Support:      Current Living Environment:  Living Arrangements: Parent  Insurance:   Hospital Account    Name Acct ID Class Status Primary Coverage   Taylor, Levick [id number] BEHAVIORAL HEALTH OBSERVATION Open BLUE CROSS BLUE SHIELD - BCBS COMM PPO        Guarantor Account (for Hospital Account [id number])    Name Relation to Pt Service Area Active? Acct Type   Ardis Rowan Self [facility] Yes Behavioral Health   Address Phone       [address] [place], [place] [address] [phone](H) [phone])          Coverage Information (for Hospital Account [id number])    F/O Payor/Plan Precert #   [facility] SHIELD/BCBS COMM PPO    Subscriber Subscriber #   Zhyon, Antenucci [id number]   Address Phone   [address] [place], [place] [address] [phone]      Legal Guardian:     Primary Care Provider:  Patient, No Pcp Per  Current Outpatient Providers:  [facility]  Psychiatrist:  Name of Psychiatrist: None  Counselor/Therapist:  Name of Therapist: None  Compliant with Medications:  No  Additional Information:   Bethann Punches 1/4/20212:17 AM

## 2019-09-24 NOTE — [facility] Counselor (Signed)
Per request, a referral packet for [facility]) was provided to patient to complete.  CSW will fax referral once completed.   Enid Cutter, MSW, LCSW-A Clinical Social Worker [facility]  [phone]

## 2019-09-24 NOTE — Tx Team (Signed)
Initial Treatment Plan 09/24/2019 3:45 PM Ardis Rowan CBU:[id number]    PATIENT STRESSORS: Health problems Substance abuse   PATIENT STRENGTHS: Physical Health Supportive family/friends   PATIENT IDENTIFIED PROBLEMS: depression  Suicide attempt  Substance abuse  Family conflicts               DISCHARGE CRITERIA:  Ability to meet basic life and health needs Improved stabilization in mood, thinking, and/or behavior Motivation to continue treatment in a less acute level of care Need for constant or close observation no longer present  PRELIMINARY DISCHARGE PLAN: Attend 12-step recovery group Outpatient therapy Participate in family therapy Return to previous living arrangement  PATIENT/FAMILY INVOLVEMENT: This treatment plan has been presented to and reviewed with the patient, Luis Acosta.  The patient and family have been given the opportunity to ask questions and make suggestions.  Raylene Miyamoto, RN 09/24/2019, 3:45 PM
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
## 2019-09-24 NOTE — Discharge Summary (Signed)
  Patient to be transferred to [facility] inpatient for psychiatric treatment 

## 2019-09-24 NOTE — Progress Notes (Signed)
Luis Acosta is a 34 y.o. male involuntary admitted for alcohol use and reckless behavior. Pt stated he has been drinking at least 12 pack of beer daily. Pt reports hx of HTN and presented with elevated BP and heart rate. Pt denies SI/HI, AVH, has been calm and cooperative with admission process. Consents signed, skin/belongings search completed and pt oriented to unit. Pt stable at this time. Pt given the opportunity to express concerns and ask questions. One time order of clonidine 0.1 mg PO given. Pt given toiletries. Will continue to monitor.

## 2019-09-24 NOTE — BH Assessment (Signed)
[facility] Assessment Progress Note  Per Nelly Rout, MD, this pt requires psychiatric hospitalization.  Royal Hawthorn, RN has assigned pt to [facility] Rm 306-2.  Pt presents under IVC initiated by pt's mother, and upheld by Dr Lucianne Muss, and IVC documents have been left at [facility].  Pt's nurse, Lanora Manis, has been notified, and agrees to call report to [phone].  Pt is to be transported via [employer].   Doylene Canning, [place] Behavioral Health Coordinator [phone]

## 2019-09-24 NOTE — Progress Notes (Signed)
Admission Note: Patient transferred to [facility] for substance abuse, anxiety and reckless behaviors.  Patient currently denies suicidal ideation and verbally contracts for safety.  Presents with anxious affect and mood.  Reports poor sleep last night.  Appears fidgety and restless on the unit.  Routine safety checks maintained every 15 minutes.  Verbalizes understanding of unit rules and protocols.  Patient is safe on the unit.

## 2019-09-24 NOTE — Progress Notes (Signed)
Adult Psychoeducational Group Note  Date:  09/24/2019 Time:  9:52 PM  Group Topic/Focus:  Wrap-Up Group:   The focus of this group is to help patients review their daily goal of treatment and discuss progress on daily workbooks.  Participation Level:  Active  Participation Quality:  Appropriate  Affect:  Appropriate  Cognitive:  Appropriate  Insight: Appropriate  Engagement in Group:  Engaged  Modes of Intervention:  Discussion  Additional Comments:  Patient attended group and participated.   Ilayda Toda W Arletha Marschke 09/24/2019, 9:52 PM

## 2019-09-24 NOTE — [facility] Group Notes (Signed)
Type of Therapy/Topic: Identifying Irrational Beliefs/Thoughts  Participation Level: Active  Description of Group: The purpose of this group is to assist patients in learning to identify irrational beliefs and thoughts that contribute to their negative emotions and experience positive emotions. Patients will be guided to discuss ways in which they have been effected by irrational thoughts and beliefs and how to transform those irrational beliefs into rational ones. Newly identified rational beliefs will be juxtaposed with experiences of positive emotions or situations, and patients will be challenged to use rational beliefs or thoughts to combat negative ones. Special emphasis will be placed on coping with irrational beliefs in conflict situations, and patients will process healthy conflict resolution skills.  Therapeutic Goals: 1. Patient will identify two irrational thoughts or beliefs  to reflect on in order to balance out those thoughts 2. Patient will label two or more irrational thoughts/beliefs that they find the most difficult to cope with 3. Patient will demonstrate positive conflict resolution skills through discussion and/or role plays that will assist in transforming irrational thoughts or beliefs into positive ones.  Summary of Patient Progress:  Due to the COVID-19 Pandemic, this group was supplemented with a worksheet and direct patient feedback.   Therapeutic Modalities: Cognitive Behavioral Therapy Feelings Identification Dialectical Behavioral Therapy 

## 2019-09-25 ENCOUNTER — Other Ambulatory Visit: Payer: Self-pay

## 2019-09-25 DIAGNOSIS — F1020 Alcohol dependence, uncomplicated: Secondary | ICD-10-CM

## 2019-09-25 LAB — TSH: TSH: 3.537 u[IU]/mL (ref 0.350–4.500)
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
## 2019-09-25 NOTE — Progress Notes (Signed)
   09/24/19 2300  Psych Admission Type (Psych Patients Only)  Admission Status Involuntary  Psychosocial Assessment  Patient Complaints None  Eye Contact Fair  Facial Expression Flat  Affect Anxious  Speech Logical/coherent  Interaction Assertive  Motor Activity Other (Comment) (pt in bed)  Appearance/Hygiene Unremarkable  Behavior Characteristics Cooperative;Anxious;Guarded  Mood Anxious;Apprehensive  Aggressive Behavior  Effect No apparent injury  Thought Process  Coherency Unable to assess  Content WDL  Delusions None reported or observed  Perception WDL  Hallucination None reported or observed  Confusion None  Danger to Self  Current suicidal ideation? Denies  Danger to Others  Danger to Others None reported or observed   Pt observed on the unit. Pt is cooperative and minimal in his responses. Pt denies SI, HI, AVH and pain. Pt contracts for safety.

## 2019-09-25 NOTE — Progress Notes (Signed)
Recreation Therapy Notes  Animal-Assisted Activity (AAA) Program Checklist/Progress Notes Patient Eligibility Criteria Checklist & Daily Group note for Rec Tx Intervention  Date: 1.5.21 Time: 1430 Location: [address]   AAA/T Program Assumption of Risk Form signed by Engineer, production or Parent Legal Guardian  YES   Patient is free of allergies or sever asthma  YES   Patient reports no fear of animals  YES   Patient reports no history of cruelty to animals  YES   Patient understands his/her participation is voluntary  YES   Patient washes hands before animal contact  YES   Patient washes hands after animal contact  YES   Behavioral Response: Engaged  Education: Charity fundraiser, Appropriate Animal Interaction   Education Outcome: Acknowledges understanding/In group clarification offered/Needs additional education.   Clinical Observations/Feedback:  Pt attended and participated in activity.    Caroll Rancher, LRT/CTRS         Caroll Rancher A 09/25/2019 3:37 PM
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
## 2019-09-25 NOTE — BHH Counselor (Signed)
Adult Comprehensive Assessment  Patient ID: Luis Acosta, male   DOB: 23-Dec-1985, 34 y.o.   MRN: [id number]  Information Source: Information source: Patient  Current Stressors:  Patient states their primary concerns and needs for treatment are:: "Drinking," denies SI and HI. IVC petitioned by mother states patient was suicidal and abusing alcohol. Patient states their goals for this hospitilization and ongoing recovery are:: "Make a discharge plan, stay sober." Educational / Learning stressors: Denies Employment / Job issues: Unemployed Family Relationships: His parents are separated and are divorcing. Lives with mother, they get along well "other than the drinking." Financial / Lack of resources (include bankruptcy): No income, has Arboriculturist / Lack of housing: He is not welcome to return to his mother's home. Plans to discharge to an [facility]. Physical health (include injuries & life threatening diseases): Denies Social relationships: Single, has some friends. Substance abuse: "Drinking way too much." Reports drinking 10-12 beers daily. Bereavement / Loss: Denies  Living/Environment/Situation:  Living Arrangements: Parent Living conditions (as described by patient or guardian): Single family home in [place] Who else lives in the home?: Mother. How long has patient lived in current situation?: Entire life, father moved out about 2-3 years ago. What is atmosphere in current home: (Not allowed to return home.)  Family History:  Marital status: Single Are you sexually active?: No What is your sexual orientation?: Straight Has your sexual activity been affected by drugs, alcohol, medication, or emotional stress?: No Does patient have children?: No  Childhood History:  By whom was/is the patient raised?: Both parents Description of patient's relationship with caregiver when they were a child: "Good" Patient's description of current relationship with people who  raised him/her: Strained with father now, he moved to [place] and works a lot. Strained with mother due to substance use but she is supportive. How were you disciplined when you got in trouble as a child/adolescent?: Appropriate discipline. Does patient have siblings?: Yes Number of Siblings: 1 Description of patient's current relationship with siblings: Older sister, good relationship. Did patient suffer any verbal/emotional/physical/sexual abuse as a child?: No Did patient suffer from severe childhood neglect?: No Has patient ever been sexually abused/assaulted/raped as an adolescent or adult?: No Was the patient ever a victim of a crime or a disaster?: No Witnessed domestic violence?: No Has patient been effected by domestic violence as an adult?: No  Education:  Highest grade of school patient has completed: Dietitian in Business Management Currently a student?: No Learning disability?: No  Employment/Work Situation:   Employment situation: Unemployed What is the longest time patient has a held a job?: 2 years Where was the patient employed at that time?: [employer] Did You Receive Any Psychiatric Treatment/Services While in Passenger transport manager?: No Are There Guns or Chiropractor in [place]?: No  Financial Resources:   Surveyor, minerals, Support from parents / caregiver Does patient have a Programmer, applications or guardian?: No  Alcohol/Substance Abuse:   What has been your use of drugs/alcohol within the last 12 months?: ETOH, daily, 12 beers Alcohol/Substance Abuse Treatment Hx: Past Tx, Outpatient, Past Tx, Inpatient If yes, describe treatment: Several inpatient substance use treatments. Recently [facility]. Prior outpatient. Has alcohol/substance abuse ever caused legal problems?: No  Social Support System:   Patient's Community Support System: Fair Describe Community Support System: Mother, father, some friends. Type of faith/religion:  Darrick Meigs How does patient's faith help to cope with current illness?: Unsure  Leisure/Recreation:   Leisure and Hobbies: Hiking,  playing golf, and traveling.  Strengths/Needs:   What is the patient's perception of their strengths?: "Have fun with other people" Patient states these barriers may affect/interfere with their treatment: Denies Patient states these barriers may affect their return to the community: Denies  Discharge Plan:   Currently receiving community mental health services: No Patient states concerns and preferences for aftercare planning are: Patient reports he is no longer interested in [address] in [place]. He wants to be referred to CDIOP at [facility]. Patient states they will know when they are safe and ready for discharge when: Once he has a good discharge plan. Does patient have access to transportation?: Yes Does patient have financial barriers related to discharge medications?: No Patient description of barriers related to discharge medications: No income, has insurance. Plan for living situation after discharge: Plans to discharge to an [facility], reports he has contact information. Will patient be returning to same living situation after discharge?: No  Summary/Recommendations:   Summary and Recommendations (to be completed by the evaluator): Luis Acosta is a 34 year old male from [facility]), he presents to [facility] under IVC petitioned by his mother for ETOH abuse and SI. Patient endorses alcohol abuse and denies SI. Patient reports his primary stressor is his parents divorce. He requests to be referred for CDIOP and plans to discharge to an [facility]. While here, Luis Acosta can benefit from crisis stabilization, therapeutic milieu, medication management, and referrals for services.  Luis Acosta. 09/25/2019

## 2019-09-25 NOTE — Progress Notes (Addendum)
   09/25/19 0820  Psych Admission Type (Psych Patients Only)  Admission Status Involuntary  Psychosocial Assessment  Patient Complaints None  Eye Contact Fair  Facial Expression Flat  Affect Anxious  Speech Logical/coherent  Interaction Assertive  Motor Activity Other (Comment) (wdl)  Appearance/Hygiene Unremarkable  Behavior Characteristics Cooperative;Anxious;Guarded  Mood Anxious;Apprehensive  Aggressive Behavior  Effect No apparent injury  Thought Process  Coherency Unable to assess  Content WDL  Delusions None reported or observed  Perception WDL  Hallucination None reported or observed  Judgment Impaired  Confusion None  Danger to Self  Current suicidal ideation? Denies  Danger to Others  Danger to Others None reported or observed   Pt's speech was coherent and followed a logical progression of thought.  Error above stating coherence "Unable to assess."

## 2019-09-25 NOTE — Progress Notes (Signed)
Adult Psychoeducational Group Note  Date:  09/25/2019 Time:  9:12 PM  Group Topic/Focus:  Wrap-Up Group:   The focus of this group is to help patients review their daily goal of treatment and discuss progress on daily workbooks.  Participation Level:  Active  Participation Quality:  Appropriate  Affect:  Appropriate  Cognitive:  Appropriate  Insight: Appropriate  Engagement in Group:  Engaged  Modes of Intervention:  Discussion  Additional Comments:  Patient attended group and participated.   Luis Acosta W Rankin Coolman 09/25/2019, 9:12 PM

## 2019-09-25 NOTE — BHH Counselor (Addendum)
Patient reports he is no longer interested in discharging to [address] [place] in [place]. Instead, he requested to be referred to [facility] and reports he plans to discharge to an [facility]. Patient confirms he has the contact information for [facility] and will attempt to schedule an interview with an [address] on [address].    --Patient's CDIOP intake appointment is scheduled for 01/11 at 8:30am.   Enid Cutter, MSW, LCSW-A Clinical Social Worker [facility]  [phone]

## 2019-09-26 MED ORDER — LISINOPRIL 2.5 MG PO TABS
2.5000 mg | ORAL_TABLET | Freq: Every day | ORAL | Status: DC
  Administered 2019-09-26 – 2019-09-28 (×3): 2.5 mg via ORAL
  Filled 2019-09-26 (×5): qty 1

## 2019-09-26 MED ORDER — ACAMPROSATE CALCIUM 333 MG PO TBEC
666.0000 mg | DELAYED_RELEASE_TABLET | Freq: Three times a day (TID) | ORAL | Status: DC
  Administered 2019-09-26 – 2019-09-28 (×7): 666 mg via ORAL
  Filled 2019-09-26 (×12): qty 2

## 2019-09-26 NOTE — Progress Notes (Signed)
   09/25/19 2030  Psych Admission Type (Psych Patients Only)  Admission Status Involuntary  Psychosocial Assessment  Patient Complaints None  Eye Contact Fair  Facial Expression Flat  Affect Anxious  Speech Logical/coherent  Interaction Assertive  Motor Activity Other (Comment) (WNL)  Appearance/Hygiene Unremarkable  Behavior Characteristics Cooperative;Anxious  Mood Anxious  Aggressive Behavior  Effect No apparent injury  Thought Process  Coherency WDL  Content WDL  Delusions None reported or observed  Perception WDL  Hallucination None reported or observed  Judgment Impaired  Confusion None  Danger to Self  Current suicidal ideation? Denies  Danger to Others  Danger to Others None reported or observed
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
## 2019-09-26 NOTE — Progress Notes (Signed)
Luis Acosta, mother, called about her son Luis Acosta.  Phone [phone].  Mother wants to discuss son's discharge and his lab work.  Mother thinks Friday discharge is too early.  Patient is aware of mother's call and talked to her on phone.

## 2019-09-26 NOTE — Tx Team (Signed)
Interdisciplinary Treatment and Diagnostic Plan Update  09/26/2019 Time of Session: 9:00am Luis Acosta MRN: [id number]  Principal Diagnosis: Bipolar 2 disorder [facility])  Secondary Diagnoses: Principal Problem:   Bipolar 2 disorder ([name]) Active Problems:   Alcohol use disorder, severe, dependence ([name])   Current Medications:  Current Facility-Administered Medications  Medication Dose Route Frequency Provider Last Rate Last Admin  . acamprosate (CAMPRAL) tablet 666 mg  666 mg Oral TID WC Cobos, Myer Peer, MD      . acetaminophen (TYLENOL) tablet 650 mg  650 mg Oral Q6H PRN Lindon Romp A, NP      . alum & mag hydroxide-simeth (MAALOX/MYLANTA) 200-200-20 MG/5ML suspension 30 mL  30 mL Oral Q4H PRN Lindon Romp A, NP      . hydrochlorothiazide (HYDRODIURIL) tablet 25 mg  25 mg Oral Daily Lindon Romp A, NP   25 mg at 09/26/19 0900  . hydrOXYzine (ATARAX/VISTARIL) tablet 25 mg  25 mg Oral TID PRN Lindon Romp A, NP      . lisinopril (ZESTRIL) tablet 2.5 mg  2.5 mg Oral Daily Connye Burkitt, NP      . loperamide (IMODIUM) capsule 2-4 mg  2-4 mg Oral PRN Lindon Romp A, NP      . LORazepam (ATIVAN) tablet 1 mg  1 mg Oral Q6H PRN Lindon Romp A, NP      . magnesium hydroxide (MILK OF MAGNESIA) suspension 30 mL  30 mL Oral Daily PRN Lindon Romp A, NP   30 mL at 09/26/19 0904  . multivitamin with minerals tablet 1 tablet  1 tablet Oral Daily Lindon Romp A, NP   1 tablet at 09/26/19 0900  . ondansetron (ZOFRAN-ODT) disintegrating tablet 4 mg  4 mg Oral Q6H PRN Lindon Romp A, NP      . thiamine tablet 100 mg  100 mg Oral Daily Lindon Romp A, NP   100 mg at 09/26/19 0900  . traZODone (DESYREL) tablet 50 mg  50 mg Oral QHS PRN Lindon Romp A, NP       PTA Medications: No medications prior to admission.    Patient Stressors: Health problems Substance abuse  Patient Strengths: Physical Health Supportive family/friends  Treatment Modalities: Medication Management, Group therapy, Case  management,  1 to 1 session with clinician, Psychoeducation, Recreational therapy.   Physician Treatment Plan for Primary Diagnosis: Bipolar 2 disorder ([name]) Long Term Goal(s): Improvement in symptoms so as ready for discharge Improvement in symptoms so as ready for discharge   Short Term Goals: Ability to identify changes in lifestyle to reduce recurrence of condition will improve Ability to verbalize feelings will improve Ability to demonstrate self-control will improve Ability to identify and develop effective coping behaviors will improve  Medication Management: Evaluate patient's response, side effects, and tolerance of medication regimen.  Therapeutic Interventions: 1 to 1 sessions, Unit Group sessions and Medication administration.  Evaluation of Outcomes: Progressing  Physician Treatment Plan for Secondary Diagnosis: Principal Problem:   Bipolar 2 disorder ([name]) Active Problems:   Alcohol use disorder, severe, dependence ([name])  Long Term Goal(s): Improvement in symptoms so as ready for discharge Improvement in symptoms so as ready for discharge   Short Term Goals: Ability to identify changes in lifestyle to reduce recurrence of condition will improve Ability to verbalize feelings will improve Ability to demonstrate self-control will improve Ability to identify and develop effective coping behaviors will improve     Medication Management: Evaluate patient's response, side effects, and tolerance of medication regimen.  Therapeutic Interventions: 1 to 1 sessions, Unit Group sessions and Medication administration.  Evaluation of Outcomes: Progressing   RN Treatment Plan for Primary Diagnosis: Bipolar 2 disorder ([name]) Long Term Goal(s): Knowledge of disease and therapeutic regimen to maintain health will improve  Short Term Goals: Ability to participate in decision making will improve, Ability to identify and develop effective coping behaviors will improve and Compliance  with prescribed medications will improve  Medication Management: RN will administer medications as ordered by provider, will assess and evaluate patient's response and provide education to patient for prescribed medication. RN will report any adverse and/or side effects to prescribing provider.  Therapeutic Interventions: 1 on 1 counseling sessions, Psychoeducation, Medication administration, Evaluate responses to treatment, Monitor vital signs and CBGs as ordered, Perform/monitor CIWA, COWS, AIMS and Fall Risk screenings as ordered, Perform wound care treatments as ordered.  Evaluation of Outcomes: Progressing   LCSW Treatment Plan for Primary Diagnosis: Bipolar 2 disorder ([name]) Long Term Goal(s): Safe transition to appropriate next level of care at discharge, Engage patient in therapeutic group addressing interpersonal concerns.  Short Term Goals: Engage patient in aftercare planning with referrals and resources, Increase social support, Increase emotional regulation, Identify triggers associated with mental health/substance abuse issues and Increase skills for wellness and recovery  Therapeutic Interventions: Assess for all discharge needs, 1 to 1 time with Social worker, Explore available resources and support systems, Assess for adequacy in community support network, Educate family and significant other(s) on suicide prevention, Complete Psychosocial Assessment, Interpersonal group therapy.  Evaluation of Outcomes: Progressing   Progress in Treatment: Attending groups: Yes. Participating in groups: Yes. Taking medication as prescribed: Yes. Toleration medication: Yes. Family/Significant other contact made: No, will contact:  mother. Patient understands diagnosis: Yes. Discussing patient identified problems/goals with staff: Yes. Medical problems stabilized or resolved: Yes. Denies suicidal/homicidal ideation: Yes. Issues/concerns per patient self-inventory: Yes.  New problem(s)  identified: Yes, Describe:  unable to return to family home, no income.  New Short Term/Long Term Goal(s): detox, medication management for mood stabilization; elimination of SI thoughts; development of comprehensive mental wellness/sobriety plan.  Patient Goals:  "Get a good treatment plan."  Discharge Plan or Barriers: Expected to discharge to an [facility], will begin CDIOP through [facility] on 01/11.  Reason for Continuation of Hospitalization: Anxiety Depression Medication stabilization  Estimated Length of Stay: 2-4 days  Attendees: Patient: Jaymin Waln 09/26/2019 9:28 AM  Physician: Javier Docker 09/26/2019 9:28 AM  Nursing: Meriam Sprague, RN 09/26/2019 9:28 AM  RN Care Manager: 09/26/2019 9:28 AM  Social Worker: Enid Cutter, LCSWA 09/26/2019 9:28 AM  Recreational Therapist:  09/26/2019 9:28 AM  Other:  09/26/2019 9:28 AM  Other:  09/26/2019 9:28 AM  Other: 09/26/2019 9:28 AM    Scribe for Treatment Team: Darreld Mclean, LCSWA 09/26/2019 9:28 AM

## 2019-09-26 NOTE — [facility] Suicide Risk Assessment (Signed)
[facility] INPATIENT:  Family/Significant Other Suicide Prevention Education  Suicide Prevention Education:  Education Completed; mother, Juanmiguel Defelice [phone]  has been identified by the patient as the family member/significant other with whom the patient will be residing, and identified as the person(s) who will aid the patient in the event of a mental health crisis (suicidal ideations/suicide attempt).  With written consent from the patient, the family member/significant other has been provided the following suicide prevention education, prior to the and/or following the discharge of the patient.  The suicide prevention education provided includes the following:  Suicide risk factors  Suicide prevention and interventions  National Suicide Hotline telephone number  [facility] assessment telephone number  [facility] Emergency Assistance 911  [facility] and/or Residential Mobile Crisis Unit telephone number  Request made of family/significant other to:  Remove weapons (e.g., guns, rifles, knives), all items previously/currently identified as safety concern.    Remove drugs/medications (over-the-counter, prescriptions, illicit drugs), all items previously/currently identified as a safety concern.  The family member/significant other verbalizes understanding of the suicide prevention education information provided.  The family member/significant other agrees to remove the items of safety concern listed above.  Patient's mother reports that patient it unable to return to her home. Patient plans to discharge to an [facility] and mother reports that patient was able to speak with someone at an [facility] last night. She asked that patient be provided additional [employer] and shelter resources as a back up.  Mother is aware that patient has an intake appointment for CDIOP on Monday, 01/11. She is agreeable to this follow up and was appreciative of the  call.  Darreld Mclean 09/26/2019, 9:54 AM

## 2019-09-26 NOTE — Progress Notes (Signed)
D:  Patient's self inventory sheet, patient has fair sleep, no sleep medication.  Good appetite, normal energy level, good concentration.  Rated depression 1, denied hopeless, anxiety 2.  Denied withdrawals.  Denied SI.  Denied physical problems.  Denied physical pain.  Goal is stay positive with discharge plans.  Make phone calls.  Discharge plans. A:  Medications administered per MD orders.  Emotional support and encouragement given patient. R:  Denied SI and HI, contracts for safety.  Denied A/V hallucinations.  Safety maintained with 15 minute checks.

## 2019-09-26 NOTE — Progress Notes (Signed)
Recreation Therapy Notes  Date:  1.6.21 Time: 0930 Location: [address] Dayroom  Group Topic: Stress Management  Goal Area(s) Addresses:  Patient will identify positive stress management techniques. Patient will identify benefits of using stress management post d/c.  Behavioral Response:  Engaged  Intervention: Stress Management  Activity :  Guided Imagery.  LRT read a script that encouraged patients to enjoy the sounds of waves while relaxing on the beach. Patients were to listen and follow along as meditation.  Education:  Stress Management, Discharge Planning.   Education Outcome: Acknowledges Education  Clinical Observations/Feedback: Pt attended and participated in activity.    Cintia Gleed, LRT/CTRS         Neamiah Sciarra A 09/26/2019 11:05 AM 

## 2019-09-26 NOTE — [facility] Counselor (Signed)
Patient is aware of his CDIOP intake appointment scheduled for Monday, 01/11.   He intends to discharge to an [address] and he spoke with residents at a home with a vacancy yesterday. He reports he will call this afternoon to ask if the vacancy can be held for him, for a potential discharge on Friday. CSW provided patient with an Copywriter, advertising for [facility] as an Engineer, site.  Enid Cutter, MSW, LCSW-A Clinical Social Worker [facility]  [phone]

## 2019-09-27 DIAGNOSIS — F3181 Bipolar II disorder: Principal | ICD-10-CM

## 2019-09-27 LAB — BASIC METABOLIC PANEL
Anion gap: 10 (ref 5–15)
BUN: 16 mg/dL (ref 6–20)
CO2: 25 mmol/L (ref 22–32)
Calcium: 9.2 mg/dL (ref 8.9–10.3)
Chloride: 102 mmol/L (ref 98–111)
Creatinine, Ser: 0.82 mg/dL (ref 0.61–1.24)
GFR calc Af Amer: >60 mL/min (ref >60)
GFR calc non Af Amer: >60 mL/min (ref >60)
Glucose, Bld: 98 mg/dL (ref 70–99)
Potassium: 3.8 mmol/L (ref 3.5–5.1)
Sodium: 137 mmol/L (ref 135–145)
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
## 2019-09-27 NOTE — Progress Notes (Signed)
   09/27/19 2100  Psych Admission Type (Psych Patients Only)  Admission Status Involuntary  Psychosocial Assessment  Patient Complaints None  Eye Contact Fair  Facial Expression Flat  Affect Anxious  Speech Logical/coherent  Interaction Assertive  Motor Activity Other (Comment) (WNL)  Appearance/Hygiene Unremarkable  Behavior Characteristics Cooperative  Mood Pleasant  Aggressive Behavior  Effect No apparent injury  Thought Process  Coherency WDL  Content WDL  Delusions None reported or observed  Perception WDL  Hallucination None reported or observed  Judgment Impaired  Confusion None  Danger to Self  Current suicidal ideation? Denies  Danger to Others  Danger to Others None reported or observed   Pt stated doing better. Trying to see how new medications helping his cravings.

## 2019-09-27 NOTE — Progress Notes (Signed)
   09/26/19 2335  Psych Admission Type (Psych Patients Only)  Admission Status Involuntary  Psychosocial Assessment  Patient Complaints None  Eye Contact Fair  Facial Expression Flat  Affect Anxious  Speech Logical/coherent  Interaction Assertive  Motor Activity Other (Comment) (WNL)  Appearance/Hygiene Unremarkable  Behavior Characteristics Cooperative;Calm  Mood Pleasant  Aggressive Behavior  Effect No apparent injury  Thought Process  Coherency WDL  Content WDL  Delusions None reported or observed  Perception WDL  Hallucination None reported or observed  Judgment Impaired  Confusion None  Danger to Self  Current suicidal ideation? Denies  Danger to Others  Danger to Others None reported or observed

## 2019-09-28 MED ORDER — ACAMPROSATE CALCIUM 333 MG PO TBEC: 666.0000 mg | DELAYED_RELEASE_TABLET | Freq: Three times a day (TID) | ORAL | 0 refills | Status: DC

## 2019-09-28 MED ORDER — TRAZODONE HCL 50 MG PO TABS: 50.0000 mg | ORAL_TABLET | Freq: Every evening | ORAL | 0 refills | Status: DC | PRN

## 2019-09-28 MED ORDER — HYDROXYZINE HCL 25 MG PO TABS: 25.0000 mg | ORAL_TABLET | Freq: Three times a day (TID) | ORAL | 0 refills | Status: DC | PRN

## 2019-09-28 MED ORDER — LISINOPRIL 2.5 MG PO TABS: 2.5000 mg | ORAL_TABLET | Freq: Every day | ORAL | 0 refills | Status: DC

## 2019-09-28 MED ORDER — HYDROCHLOROTHIAZIDE 25 MG PO TABS: 25.0000 mg | ORAL_TABLET | Freq: Every day | ORAL | 0 refills | Status: DC

## 2019-09-28 NOTE — BHH Suicide Risk Assessment (Signed)
[facility] Discharge Suicide Risk Assessment   Principal Problem: Bipolar 2 disorder [facility]) Discharge Diagnoses: Principal Problem:   Bipolar 2 disorder ([name]) Active Problems:   Alcohol use disorder, severe, dependence ([name])   Total Time spent with patient: 30 minutes  Musculoskeletal: Strength & Muscle Tone: within normal limits no tremors, no diaphoresis Gait & Station: normal Patient leans: N/A  Psychiatric Specialty Exam: Review of Systems  No headache , no chest pain, no shortness of breath, no vomiting   Blood pressure (!) 141/93, pulse 83, temperature (!) 97.4 F (36.3 C), temperature source Oral, resp. rate 16, height 6\' 2"  (1.88 m), weight 136.1 kg, SpO2 95 %.Body mass index is 38.52 kg/m.  General Appearance: improved grooming ]  Eye Contact::  Good  Speech:  Normal Rate409  Volume:  Normal  Mood:  mood is improved and described as 8/10 with 10 being best   Affect:  Appropriate and more reactive   Thought Process:  Linear and Descriptions of Associations: Intact  Orientation:  Full (Time, Place, and Person)  Thought Content:  no hallucinations, no delusions   Suicidal Thoughts:  No denies suicidal or self injurious ideations, denies homicidal or violent ideations  Homicidal Thoughts:  No  Memory:  recent and remote grossly intact   Judgement:  Other:  improving   Insight:  Fair/ improving  Psychomotor Activity:  Normal  Concentration:  Good  Recall:  Good  Fund of Knowledge:Good  Language: Good  Akathisia:  Negative  Handed:  Right  AIMS (if indicated):     Assets:  Communication Skills Desire for Improvement Resilience  Sleep:  Number of Hours: 6.75  Cognition: WNL  ADL's:  Intact   Mental Status Per Nursing Assessment::   On Admission:  NA  Demographic Factors:  33, single, no children, was living with mother prior to admission, reports he intends to live in an [phone] at discharge  Loss Factors: alcohol use disorder, financial  stressors  Historical Factors: History of alcohol use disorder , he reports prior mood episodes have been substance induced   Risk Reduction Factors:   Sense of responsibility to family and Positive coping skills or problem solving skills  Continued Clinical Symptoms:  At this time patient is alert, attentive, well related, calm, pleasant on approach, with improving mood and range of affect , no thought disorder, no suicidal or self injurious ideations, no homicidal or violent ideations , no hallucinations, no delusions. Behavior on unit in good control, pleasant on approach. No residual symptoms of WDL noted or reported . Tolerating medications well - on Acamprosate . Side effects reviewed   Cognitive Features That Contribute To Risk:  No gross cognitive deficits noted upon discharge. Is alert , attentive, and oriented x 3   Suicide Risk:  Mild:  Suicidal ideation of limited frequency, intensity, duration, and specificity.  There are no identifiable plans, no associated intent, mild dysphoria and related symptoms, good self-control (both objective and subjective assessment), few other risk factors, and identifiable protective factors, including available and accessible social support.  Follow-up Information    [facility]. Go on 10/01/2019.   Specialty: Behavioral Health Why: Your intake assessment for CDIOP is scheduled for 01/11 at 8:30am. The appointment is scheduled for in person, you need to wear a mask! Please bring photo ID and insurance information.  Contact information: [address] [place] [phone]          Plan Of Care/Follow-up recommendations:  Activity:  as tolerated  Diet:  heart healthy Tests:  NA Other:  See below  Patient is expressing readiness for discharge , and is leaving unit in good spirits  Plans to go to an [employer] .  Plans to follow up as above  Also referred to  PCP ( [place] ) for medical management as needed   Jenne Campus, MD 09/28/2019, 9:10 AM
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
## 2019-09-28 NOTE — Progress Notes (Signed)
Recreation Therapy Notes  Date:  1.8.21 Time: 0930 Location: [address] Dayroom  Group Topic: Stress Management  Goal Area(s) Addresses:  Patient will identify positive stress management techniques. Patient will identify benefits of using stress management post d/c.  Intervention: Stress Management  Activity :  Meditation.  LRT played a meditation that focused on choices.  Patients were to listen and follow along as meditation played to engage.  Education:  Stress Management, Discharge Planning.   Education Outcome: Acknowledges Education  Clinical Observations/Feedback: Pt did not attend group.    Caroll Rancher, LRT/CTRS    Lillia Abed, Mukesh Kornegay A 09/28/2019 11:10 AM

## 2019-09-28 NOTE — Progress Notes (Signed)
  [facility] Adult Case Management Discharge Plan :  Will you be returning to the same living situation after discharge:  No. Entering an [employer]. At discharge, do you have transportation home?: Yes,  mother will pick up. Do you have the ability to pay for your medications: Yes,  BCBS insurance.  Release of information consent forms completed and in the chart; letter on chart.  Patient to Follow up at: Follow-up Information    [facility]. Go on 10/01/2019.   Specialty: Behavioral Health Why: Your intake assessment for CDIOP is scheduled for 01/11 at 8:30am. The appointment is scheduled for in person, you need to wear a mask! Please bring photo ID and insurance information.  Contact information: [address] [place] [address] [phone]          Next level of care provider has access to [facility] Link:yes  Safety Planning and Suicide Prevention discussed: Yes,  with mother.   Has patient been referred to the Quitline?: N/A patient is not a smoker  Patient has been referred for addiction treatment: Yes  Darreld Mclean, LCSWA 09/28/2019, 8:46 AM

## 2019-10-01 ENCOUNTER — Ambulatory Visit (INDEPENDENT_AMBULATORY_CARE_PROVIDER_SITE_OTHER): Payer: BC Managed Care – PPO | Admitting: Licensed Clinical Social Worker

## 2019-10-01 ENCOUNTER — Other Ambulatory Visit: Payer: Self-pay

## 2019-10-01 DIAGNOSIS — F1094 Alcohol use, unspecified with alcohol-induced mood disorder: Secondary | ICD-10-CM

## 2019-10-01 DIAGNOSIS — F1020 Alcohol dependence, uncomplicated: Secondary | ICD-10-CM

## 2019-10-02 ENCOUNTER — Telehealth (HOSPITAL_COMMUNITY): Payer: Self-pay | Admitting: Licensed Clinical Social Worker
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
## 2019-10-08 ENCOUNTER — Encounter (HOSPITAL_COMMUNITY): Payer: Self-pay | Admitting: Medical

## 2019-10-08 ENCOUNTER — Other Ambulatory Visit: Payer: Self-pay

## 2019-10-08 ENCOUNTER — Other Ambulatory Visit (HOSPITAL_COMMUNITY): Payer: BC Managed Care – PPO | Attending: Psychiatry | Admitting: Licensed Clinical Social Worker

## 2019-10-08 VITALS — BP 139/88 | HR 89 | Ht 74.0 in | Wt 300.0 lb

## 2019-10-08 DIAGNOSIS — Z560 Unemployment, unspecified: Secondary | ICD-10-CM | POA: Insufficient documentation

## 2019-10-08 DIAGNOSIS — F10229 Alcohol dependence with intoxication, unspecified: Secondary | ICD-10-CM | POA: Diagnosis present

## 2019-10-08 DIAGNOSIS — Z818 Family history of other mental and behavioral disorders: Secondary | ICD-10-CM | POA: Diagnosis not present

## 2019-10-08 DIAGNOSIS — E669 Obesity, unspecified: Secondary | ICD-10-CM | POA: Diagnosis not present

## 2019-10-08 DIAGNOSIS — F1994 Other psychoactive substance use, unspecified with psychoactive substance-induced mood disorder: Secondary | ICD-10-CM

## 2019-10-08 DIAGNOSIS — F1211 Cannabis abuse, in remission: Secondary | ICD-10-CM | POA: Diagnosis not present

## 2019-10-08 DIAGNOSIS — F909 Attention-deficit hyperactivity disorder, unspecified type: Secondary | ICD-10-CM | POA: Insufficient documentation

## 2019-10-08 DIAGNOSIS — Z91018 Allergy to other foods: Secondary | ICD-10-CM | POA: Diagnosis not present

## 2019-10-08 DIAGNOSIS — F17200 Nicotine dependence, unspecified, uncomplicated: Secondary | ICD-10-CM | POA: Insufficient documentation

## 2019-10-08 DIAGNOSIS — M109 Gout, unspecified: Secondary | ICD-10-CM | POA: Diagnosis not present

## 2019-10-08 DIAGNOSIS — F1020 Alcohol dependence, uncomplicated: Secondary | ICD-10-CM

## 2019-10-08 DIAGNOSIS — Z8659 Personal history of other mental and behavioral disorders: Secondary | ICD-10-CM

## 2019-10-08 DIAGNOSIS — F319 Bipolar disorder, unspecified: Secondary | ICD-10-CM | POA: Insufficient documentation

## 2019-10-08 DIAGNOSIS — F1411 Cocaine abuse, in remission: Secondary | ICD-10-CM

## 2019-10-08 DIAGNOSIS — F419 Anxiety disorder, unspecified: Secondary | ICD-10-CM | POA: Diagnosis not present

## 2019-10-08 DIAGNOSIS — G479 Sleep disorder, unspecified: Secondary | ICD-10-CM

## 2019-10-08 DIAGNOSIS — I10 Essential (primary) hypertension: Secondary | ICD-10-CM | POA: Diagnosis not present

## 2019-10-08 DIAGNOSIS — Z6372 Alcoholism and drug addiction in family: Secondary | ICD-10-CM

## 2019-10-08 DIAGNOSIS — Z79899 Other long term (current) drug therapy: Secondary | ICD-10-CM | POA: Diagnosis not present

## 2019-10-08 DIAGNOSIS — Z811 Family history of alcohol abuse and dependence: Secondary | ICD-10-CM
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
## 2019-10-08 NOTE — Telephone Encounter (Signed)
Client previously spoke with case manager and left VM he would like to engage in services as of last week. Clinician contacted on 10/05/19 with no answer. Clinician unable to leave a VM this day due to full VM

## 2019-10-09 NOTE — Progress Notes (Signed)
    Daily Group Progress Note  Program: CD-IOP   Group Time: 1pm-2:30pm Participation Level: Active Behavioral Response: Appropriate Type of Therapy: Process Group  Topic: Clinician checked in with clients, assessing for SI/HI/psychosis and overall level of functioning. Clinician and group members discussed 'highs' and 'lows' over the weekend, including any triggers or coping skills used. Clinician and group members processed the effect of recovery vs active addition in responding to stressful events from the weekend. Clinician and group members read and responded to AA Daily Reflection and NA Just for Today, focusing on simple inventory and habits bringing satisfaction. Group members processed reviewing thoughts and feelings, highs and lows, daily rather than multiple days at a time as a way to manage coping with difficult feelings thoughts. Clients shared struggle with 'feeling feelings' can be overwhelming and it is easy to return to numbing feelings if not in recovery mindset. Clients also shared daily review as a way to be mindful of successes throughout the day.    Group Time: 2:30pm-4pm Participation Level: Active Behavioral Response: Appropriate Type of Therapy: Psycho-education Group  Topic: Clinician presented psycho-educational material on the neurobiology of addiction and the effect of substances on dopamine production and the role of dopamine in motivation. Clinician reviewed with clients the CBT triangle and connection of thoughts, feelings, and behaviors. Clinician discussed with clients Automatic thoughts and addressing distorted thinking with realistic thoughts. Clinician and group members discussed the power behind re-framing word choices effecting mood about situations, such as boring vs stable, or challenging vs opportunity. Clinician inquired about self-care activity planned before next scheduled group.    Summary: Client sobriety date remains 09/23/19. Client shared what  brought him to group and goals for treatment. Client shared what went well over the weekend with no major concerns. Client reports appreciating accountability of group and [facility] at this time in his recovery. Client shows progress toward goals AEB maintaining sobriety, engaging in community meetings, and being receptive to psycho-educational information presented in session. See PA-C note for additional details.   Family Program: Family present? No   Name of family member(s): NA  UDS collected: No   AA/NA attended?: Yes; daily via zoom, looking for in person meetings close to home  Sponsor?: Yes   [name], LCSW

## 2019-10-10 ENCOUNTER — Other Ambulatory Visit: Payer: Self-pay

## 2019-10-10 ENCOUNTER — Other Ambulatory Visit (HOSPITAL_COMMUNITY): Payer: BC Managed Care – PPO | Admitting: Licensed Clinical Social Worker

## 2019-10-10 ENCOUNTER — Telehealth (HOSPITAL_COMMUNITY): Payer: Self-pay | Admitting: Licensed Clinical Social Worker

## 2019-10-10 DIAGNOSIS — F1211 Cannabis abuse, in remission: Secondary | ICD-10-CM

## 2019-10-10 DIAGNOSIS — F10229 Alcohol dependence with intoxication, unspecified: Secondary | ICD-10-CM | POA: Diagnosis not present

## 2019-10-10 DIAGNOSIS — F1411 Cocaine abuse, in remission: Secondary | ICD-10-CM

## 2019-10-10 DIAGNOSIS — F1020 Alcohol dependence, uncomplicated: Secondary | ICD-10-CM

## 2019-10-10 DIAGNOSIS — F1994 Other psychoactive substance use, unspecified with psychoactive substance-induced mood disorder: Secondary | ICD-10-CM

## 2019-10-11 ENCOUNTER — Other Ambulatory Visit (HOSPITAL_COMMUNITY): Payer: BC Managed Care – PPO | Admitting: Licensed Clinical Social Worker

## 2019-10-11 ENCOUNTER — Other Ambulatory Visit: Payer: Self-pay

## 2019-10-11 DIAGNOSIS — F1094 Alcohol use, unspecified with alcohol-induced mood disorder: Secondary | ICD-10-CM

## 2019-10-11 DIAGNOSIS — F10229 Alcohol dependence with intoxication, unspecified: Secondary | ICD-10-CM | POA: Diagnosis not present

## 2019-10-11 NOTE — Progress Notes (Signed)
    Daily Group Progress Note  Program: CD-IOP   Group Time: 1pm-2pm Participation Level: Active Behavioral Response: Appropriate Type of Therapy: Process Group  Topic: Clinician met with clients, assessing for SI/HI/psychosis and overall level of functioning including sobriety dates, and recent triggers for cravings or changes in mood. Clinician and group members processed the effect of lack of motivation currently compared to previous points in life. Clinician challenged clients to be mindful of perception with comparisons with current 'broken brain' capacity. Clinician and group members read and processed Daily Reflection as it relates to current place in recovery.   Group Time: 2pm04pm Participation Level: Active Behavioral Response: Appropriate Type of Therapy: Psycho-education Group  Topic: Clinician presented the psycho-educational topic of addressing Anger. Clinician and group members discussed anger as a secondary emotion and how/where expression of anger was learned and accepted growing up. Clinician and group members identified physical symptoms of 'small' anger through 'overwhelming' anger. Clinician encouraged group members to be mindful of body sensations to address uncomfortable feelings individually at a less intense level. Clinician and group members started recognizing anger triggers in different settings, and how anger is felt in the body at different intensities. Clinician utilized [facility] Understanding Anger: Tips for Managing Anger worksheet. Clinician presented [employer], focused on healthy communication during disagreements.   Summary: Client sobriety date remains 09/23/19.  Client shared about recent incident with anger around his mother. Client shared additional emotions which would be called 'annoyed.' Client appropriately engaged with group members, asking challenging and clarifying questions to new group member. Client shows progress toward his goals AEB  maintaining sobriety, building a support system, and managing recent frustration with roommate while avoiding inappropriate anger reation.   Family Program: Family present? No   Name of family member(s): N/A  UDS collected: Yes Results: pending  AA/NA attended?: Yes   Olegario Messier, LCSW

## 2019-10-12 NOTE — Progress Notes (Signed)
    Daily Group Progress Note  Program: CD-IOP   Group Time: 1pm-2:30 Participation Level: Active Behavioral Response: Appropriate Type of Therapy: process group  Topic: Clinician checked in with clients, assessing for SI/HI/psychosis and overall level of functioning. Clinician and group members discussed 'highs' and 'lows' over the weekend, including any triggers or coping skills used. Clinician and group members processed the effect of recovery vs active addition in responding to stressful events from the week. Clinician and group members read and responded to AA Daily Reflection. Clinician provided mindfulness activity focused on grounding and being present in the moment.    Group Time: 2:30-pm Participation Level: Active Behavioral Response: Appropriate Type of Therapy: Psychoeducational group  Topic: Clinician presented psycho-educational material on Distorted thinking styles. Clinician and group members reviewed examples of each distortion. Clinician and group members identified recent cognitive distortions and practiced in session creating alternate thought patterns. Clinician and group members discussed practicing small changes daily creating long term changes in beliefs. Clinician inquired about self-care activity planned for the weekend.    Summary: Client engaged in process group discussions and participated in mindfulness activity. Client shows progress toward goals AEB maintaining sobriety date of 09/23/19. Client endorses needing to attend more meetings. Client is able to identify some distorted thought patterns however is minimally engaged in creating alternative thoughts.   Family Program: Family present? No   Name of family member(s): NA  UDS collected: No Results: UDS from 10/10/19 negative for substances  AA/NA attended?: Yes  Sponsor?: No   [name], LCSW

## 2019-10-15 ENCOUNTER — Other Ambulatory Visit (INDEPENDENT_AMBULATORY_CARE_PROVIDER_SITE_OTHER): Payer: BC Managed Care – PPO | Admitting: Licensed Clinical Social Worker

## 2019-10-15 ENCOUNTER — Encounter: Payer: Self-pay | Admitting: Medical

## 2019-10-15 ENCOUNTER — Other Ambulatory Visit: Payer: Self-pay

## 2019-10-15 DIAGNOSIS — F10229 Alcohol dependence with intoxication, unspecified: Secondary | ICD-10-CM | POA: Diagnosis not present

## 2019-10-15 DIAGNOSIS — E669 Obesity, unspecified: Secondary | ICD-10-CM

## 2019-10-15 DIAGNOSIS — I10 Essential (primary) hypertension: Secondary | ICD-10-CM

## 2019-10-15 DIAGNOSIS — F1411 Cocaine abuse, in remission: Secondary | ICD-10-CM

## 2019-10-15 DIAGNOSIS — F1020 Alcohol dependence, uncomplicated: Secondary | ICD-10-CM

## 2019-10-15 DIAGNOSIS — F1211 Cannabis abuse, in remission: Secondary | ICD-10-CM

## 2019-10-15 DIAGNOSIS — Z6372 Alcoholism and drug addiction in family: Secondary | ICD-10-CM

## 2019-10-15 DIAGNOSIS — G479 Sleep disorder, unspecified: Secondary | ICD-10-CM

## 2019-10-15 DIAGNOSIS — F1994 Other psychoactive substance use, unspecified with psychoactive substance-induced mood disorder: Secondary | ICD-10-CM

## 2019-10-15 DIAGNOSIS — Z8659 Personal history of other mental and behavioral disorders: Secondary | ICD-10-CM

## 2019-10-15 DIAGNOSIS — Z811 Family history of alcohol abuse and dependence: Secondary | ICD-10-CM

## 2019-10-15 DIAGNOSIS — Z8739 Personal history of other diseases of the musculoskeletal system and connective tissue: Secondary | ICD-10-CM

## 2019-10-15 NOTE — Progress Notes (Signed)
   [facility] Follow-up Outpatient CDIOP Date: 10/15/2019 Admission Date: 10/08/2019 Sobriety date: 09/23/2019  Subjective: " I forgot to take my Campral at noon.It's the first time I've missed"  HPI : Initial CD IOP Provider FU Visit Pt continues to adjust to life in [facility] and public transportation. He recalls Baclofen caused him to sweat excessively. He does not feel compelled to use at this point. He has no other complaints. He does have history of Gout but no recent flares.  Review of Systems: Psychiatric: Agitation: No Hallucination: No Depressed Mood: PHQ9 negative Insomnia: Rx Trazodone PRN Hypersomnia: No Altered Concentration: No Feels Worthless: No Grandiose Ideas: No Belief In Special Powers: No New/Increased Substance Abuse: No Compulsions: No  Neurologic: Headache: No Seizure: No Paresthesias: No  Current Medications: acamprosate 333 MG tablet Commonly known as: CAMPRAL Take 2 tablets (666 mg total) by mouth 3 (three) times daily with meals. For alcoholism  colchicine 0.6 MG tablet Take by mouth.  hydrochlorothiazide 25 MG tablet Commonly known as: HYDRODIURIL Take 1 tablet (25 mg total) by mouth daily. For high blood pressure  hydrOXYzine 25 MG tablet Commonly known as: ATARAX/VISTARIL Take 1 tablet (25 mg total) by mouth 3 (three) times daily as needed for anxiety.  lisinopril 2.5 MG tablet Commonly known as: ZESTRIL Take 1 tablet (2.5 mg total) by mouth daily. For high blood pressure  olopatadine 0.1 % ophthalmic solution Commonly known as: PATANOL Place 1 drop into both eyes 2 times daily for 10 days.  traZODone 50 MG tablet Commonly known      Mental Status Examination  Appearance:Casual Neat Alert: Yes Attention: good  Cooperative: Yes Eye Contact: Good Speech: Clear and coherent Psychomotor Activity: Normal Memory/Concentration: Normal/intact Oriented: person, place, time/date and situation Mood: Euthymic Affect:  Appropriate and Congruent Thought Processes and Associations: Coherent and Intact Fund of Knowledge: Good Thought Content: WDL Insight: Lacking  Judgement: Impaired  UDS:10/10/2019 Clear PDMP Clear  Diagnosis:  0 Alcohol use disorder, severe, dependence ([name]) 0 Cocaine abuse, 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) 0 Tetrahydrocannabinol (THC) use disorder, mild, in early remission, abuse 0 Dysfunctional family due to alcoholism 0 Alcoholism in family member 0 Obesity (BMI 35.0-39.9 without comorbidity) 0 Sleep disorder, unspecified 0 Essential hypertension 0 History of ADHD 0 Hx of acute gouty arthritis  Assessment:  Beginning 5th treatment experience for dependence-Pt is grandchild of 2 Adult Children of Alcoholic parents with no record of treatment for this Co Addiction history([facility] [place] EDD)  Treatment Plan: Per admission.Routine FU 2 weeks sooner if needed Luis Morn, PA-CPatient ID: Luis Acosta, male   DOB: 08-Sep-1986, 34 y.o.   MRN: [id number]

## 2019-10-15 NOTE — Progress Notes (Signed)
    Daily Group Progress Note  Program: CD-IOP   Group Time: 1-2:30pm  Participation Level: Active  Behavioral Response: Appropriate  Type of Therapy: Process Group  Topic: Clinician facilitated a check in and asked group members to identify their name, sobriety date, and how many recovery or support meetings they have attended since our last session. Clinician encouraged group members to process their recent weekend and any stressors, praises, triggers, or struggles with relapse that they may have dealt with. Clinician utilized active listening and provided praise for group members throughout the check in process.   Group Time: 2:30-4pm  Participation Level: Active  Behavioral Response: Appropriate  Type of Therapy: Psycho-education Group  Topic: Clinician presented a psychoeducational handout on values and initiated discussion on ways that personal values may influence thoughts, feelings, behaviors, or relationships. Clinician provided information on value congruency aligning with behaviors and presented an activity in which members identified values that their behaviors are both congruent and incongruent with. Members engaged in discussion on their personal values and how values have impacted their sobriety and recovery.    Summary: Client checked in and reported a sobriety date of 09/23/19 and denied having attended a meeting since our last group session. Client actively participated in group discussion when prompted by this clinician. Client identified personal values that he would like to continue working on such as independence. Client minimized his sobriety by referring to it as "a small thing" however was receptive to feedback on the minimization.    Family Program: Family present? No   Name of family member(s): n/a  UDS collected: Yes Results: no substances detected  AA/NA attended?: No  Sponsor?: No   [name], LCSW

## 2019-10-17 ENCOUNTER — Other Ambulatory Visit (HOSPITAL_COMMUNITY): Payer: BC Managed Care – PPO | Admitting: Licensed Clinical Social Worker

## 2019-10-17 ENCOUNTER — Other Ambulatory Visit: Payer: Self-pay

## 2019-10-17 DIAGNOSIS — F10229 Alcohol dependence with intoxication, unspecified: Secondary | ICD-10-CM | POA: Diagnosis not present

## 2019-10-17 DIAGNOSIS — F1020 Alcohol dependence, uncomplicated: Secondary | ICD-10-CM

## 2019-10-18 ENCOUNTER — Encounter (HOSPITAL_COMMUNITY): Payer: Self-pay | Admitting: Licensed Clinical Social Worker

## 2019-10-18 ENCOUNTER — Other Ambulatory Visit: Payer: Self-pay

## 2019-10-18 ENCOUNTER — Other Ambulatory Visit (HOSPITAL_COMMUNITY): Payer: BC Managed Care – PPO

## 2019-10-22 ENCOUNTER — Other Ambulatory Visit (HOSPITAL_COMMUNITY): Payer: BC Managed Care – PPO | Attending: Psychiatry | Admitting: Licensed Clinical Social Worker

## 2019-10-22 ENCOUNTER — Other Ambulatory Visit: Payer: Self-pay

## 2019-10-22 DIAGNOSIS — E669 Obesity, unspecified: Secondary | ICD-10-CM | POA: Diagnosis not present

## 2019-10-22 DIAGNOSIS — Z79899 Other long term (current) drug therapy: Secondary | ICD-10-CM | POA: Diagnosis not present

## 2019-10-22 DIAGNOSIS — F1411 Cocaine abuse, in remission: Secondary | ICD-10-CM | POA: Insufficient documentation

## 2019-10-22 DIAGNOSIS — F319 Bipolar disorder, unspecified: Secondary | ICD-10-CM | POA: Insufficient documentation

## 2019-10-22 DIAGNOSIS — G479 Sleep disorder, unspecified: Secondary | ICD-10-CM | POA: Diagnosis not present

## 2019-10-22 DIAGNOSIS — Z7901 Long term (current) use of anticoagulants: Secondary | ICD-10-CM | POA: Diagnosis not present

## 2019-10-22 DIAGNOSIS — F1094 Alcohol use, unspecified with alcohol-induced mood disorder: Secondary | ICD-10-CM

## 2019-10-22 DIAGNOSIS — F1994 Other psychoactive substance use, unspecified with psychoactive substance-induced mood disorder: Secondary | ICD-10-CM

## 2019-10-22 DIAGNOSIS — F1020 Alcohol dependence, uncomplicated: Secondary | ICD-10-CM | POA: Diagnosis not present

## 2019-10-22 DIAGNOSIS — I10 Essential (primary) hypertension: Secondary | ICD-10-CM | POA: Insufficient documentation

## 2019-10-22 DIAGNOSIS — F1211 Cannabis abuse, in remission: Secondary | ICD-10-CM | POA: Diagnosis not present

## 2019-10-22 DIAGNOSIS — Z6835 Body mass index (BMI) 35.0-35.9, adult: Secondary | ICD-10-CM | POA: Diagnosis not present

## 2019-10-22 NOTE — Progress Notes (Signed)
    Daily Group Progress Note  Program: CD-IOP   Group Time: 1-2:30pm  Participation Level: Active  Behavioral Response: Appropriate  Type of Therapy: Process Group  Topic: Clinician met with group assessing SI/HI/psychosis and overall level of functioning, including strengths and struggles with recovery. Clinician and group members processed the recent weekend and any current concerns, stressors, or praises that they may have. Clinician and group members read and processed Daily reflection.    Group Time: 2:30-4pm  Participation Level: Active  Behavioral Response: Appropriate  Type of Therapy: Psycho-education Group  Topic: Psycho-educational group co-facilitated by fellow therapist who taught basic relaxation, breathing, stretching, and yoga skills. Facilitator encouraged group members to identify what they found helpful in today's exercise and encouraged members to engage in these practices moving forward to improve emotional regulation.    Summary: Client reported a sobriety date of 10/03/19 and reported having attended 2 support meetings since our last group session. Client was active in group discussion and asked clarifying questions and provided support to fellow members. Client discussed feeling bored over the weekend and the behaviors that boredom has triggered in the past.   Family Program: Family present? No   Name of family member(s): n/a  UDS collected: Yes Results: UDS results pending  AA/NA attended?: Yes  Sponsor?: Yes   Olegario Messier, LCSW

## 2019-10-24 ENCOUNTER — Other Ambulatory Visit (INDEPENDENT_AMBULATORY_CARE_PROVIDER_SITE_OTHER): Payer: BC Managed Care – PPO | Admitting: Licensed Clinical Social Worker

## 2019-10-24 ENCOUNTER — Encounter (HOSPITAL_COMMUNITY): Payer: Self-pay | Admitting: Medical

## 2019-10-24 DIAGNOSIS — E669 Obesity, unspecified: Secondary | ICD-10-CM

## 2019-10-24 DIAGNOSIS — F319 Bipolar disorder, unspecified: Secondary | ICD-10-CM | POA: Diagnosis not present

## 2019-10-24 DIAGNOSIS — F1094 Alcohol use, unspecified with alcohol-induced mood disorder: Secondary | ICD-10-CM

## 2019-10-24 DIAGNOSIS — F1211 Cannabis abuse, in remission: Secondary | ICD-10-CM

## 2019-10-24 DIAGNOSIS — Z6372 Alcoholism and drug addiction in family: Secondary | ICD-10-CM

## 2019-10-24 DIAGNOSIS — F1020 Alcohol dependence, uncomplicated: Secondary | ICD-10-CM

## 2019-10-24 DIAGNOSIS — F1411 Cocaine abuse, in remission: Secondary | ICD-10-CM

## 2019-10-24 DIAGNOSIS — F1994 Other psychoactive substance use, unspecified with psychoactive substance-induced mood disorder: Secondary | ICD-10-CM

## 2019-10-24 MED ORDER — BUPROPION HCL ER (XL) 300 MG PO TB24: 300.0000 mg | ORAL_TABLET | ORAL | 2 refills | Status: DC

## 2019-10-24 NOTE — Progress Notes (Signed)
  S- Discussed at team meeting.Pt unsure of Campral effectiveness. ? Mood -Says he remembers doing well with Wellbutrin at [facility] but unable to continue after discharge.Admits to mood swings but not cravings. Wants to try Wellbutrin.  Got his 30 day chip yesterday at AA. [facility] is on him about failure to get sponsor O-   Mental Status Examination   Appearance: Casually dressed Alert: Yes Attention: Good  Cooperative: Yes Eye Contact: Fair Speech: normal in volume, rate, tone, spontaneous Psychomotor Activity: Decreased Memory/Concentration: Intact for visit Oriented: person, place,time and situation Mood: Dysthymic Affect: Congruent  Thought Processes and Associations: Goal Directed and Intact Fund of Knowledge: WDL Thought Content: Suicidal ideation, Homicidal ideation, Auditory hallucinations, Visual hallucinations, Delusions and Paranoia,                                    none  Insight: Fair  Judgement: Fair   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

## 2019-10-25 ENCOUNTER — Other Ambulatory Visit (HOSPITAL_COMMUNITY): Payer: BC Managed Care – PPO | Admitting: Licensed Clinical Social Worker

## 2019-10-25 ENCOUNTER — Other Ambulatory Visit: Payer: Self-pay

## 2019-10-25 DIAGNOSIS — F319 Bipolar disorder, unspecified: Secondary | ICD-10-CM | POA: Diagnosis not present

## 2019-10-25 DIAGNOSIS — F1020 Alcohol dependence, uncomplicated: Secondary | ICD-10-CM

## 2019-10-25 NOTE — Progress Notes (Signed)
    Daily Group Progress Note  Program: CD-IOP   Group Time: 1-2:30pm   Participation Level: Active   Behavioral Response: Appropriate   Type of Therapy: Process Group   Topic: Clinician checked in with group members, assessing for SI/HI/psychosis and overall level of functioning, including cravings, relapse, and participation in community support meetings. Clinician and group members processed 'highs' and 'lows' since last group and how challenges effected their thoughts or behaviors. Clinician and group members read and processed AA Daily Reflection related to Guilt.     Group Time: 2:30pm-4pm   Participation Level: Active   Behavioral Response: Appropriate   Type of Therapy: Psycho-education Group   Topic: Clinician provided interactive mindfulness activity 'Handful of Quiet.' Clinician presented the psycho-education topic of Guilt and Shame. Clinician presented Dewain Penning video 'Listening to Shame.' Clinician and group members processed thoughts, feelings, behaviors, and previous events related to Guilt, Toxic Guilt, and Shame. Clinician provided active listening, summarizing statements and validated client feelings. Clinician presented the option to replace 'I'm sorry' when shaming with 'Thank you for.' to avoid inappropriate guilt with gratitude. Clinician and group celebrated graduation of group member.   Summary: Client checked in appropriately, engaging with group members and sharing moments related to guilt or shame. Client shows progress toward goals AEB maintaining sobriety and improving identification of secondary emotions. Client shared some guilt related to anger behaviors, but limited guilt related to substance use due to not seeing much in the way of specifically or physically hurting people or damaging property, though acknowledges the possibility of emotional hurt.   Family Program: Family present? No   Name of family member(s): NA  UDS collected: No Results:  No substances found  AA/NA attended?: Yes   Sponsor?No   [name], LCSW

## 2019-10-26 NOTE — Progress Notes (Signed)
    Daily Group Progress Note  Program: CD-IOP   Group Time: 1pm-2:30pm  Participation Level: Active  Behavioral Response: Appropriate  Type of Therapy: Process Group  Topic: Clinician checked in with group members, assessing for SI/HI/psychosis and overall level of functioning including difficulties with cravings or relapse. Clinician and group members processed 'highs and lows' since last group. Clinician and group members processed finding moments of joy in sobriety and accepting deserving moments of happiness in recovery. Clinician and group members read AA Daily Reflection 'Filling the Void' and discussed the need for distraction skills in early recovery to help cope with overwhelming emotions in the moment.   Group Time: 2:30pm-4pm  Participation Level: Active  Behavioral Response: Appropriate  Type of Therapy: Psycho-education Group  Topic: Clinician presented the topic of Resentment and forgiveness. Group discussed resentment being one of the top reasons for relapse in early recovery. Clinician and group members started activity focused on identifying a resentment and related thoughts and feelings. Clinician inquired about small self-care activity for the evening.   Summary: Client shared frustration with mixed expectations in the [facility]. Client identifies moving home with his mother is an option however due to dependency issues believes remaining in the [facility] would be better for his recovery. Client is encouraged by group members to discuss his concern about unknown expectations in the [facility] related to treatment and work with other house members for clarification. Client maintains sobriety date of 09/23/19.   Family Program: Family present? No   Name of family member(s): NA; candidate for family therapy with mother  UDS collected: No Results: pending  AA/NA attended?: No Sponsor?: No   [name], LCSW

## 2019-10-26 NOTE — Progress Notes (Signed)
    Daily Group Progress Note  Program: CD-IOP   Group Time: 1pm-2:30pm Participation Level: Active Behavioral Response: Appropriate Type of Therapy: Process Group Topic: Clinician met with group members, assessing for SI/HI/psychosis and overall level of functioning, including cravings or relapse. Clinician and group members processed 'highs and lows' from the previous day and any struggles in maintaining sobriety. Clinician and group members identified known and unknown triggers for relapse and family response to relapse. Clinician and group members read AA Daily Reflection focused on spirituality in recovery and the focus on 'keep coming back.' Clinician and group members read and processed NA daily meditation with a focus on "Feeling Good Isn't the Point" and sitting in uncomfortable feelings is a strength built during recovery rather than avoidance.   Group Time: 2:30pm-4pm Participation Level: Active Behavioral Response: Appropriate Type of Therapy: Psycho-education Group Topic: Psycho-education and skills group focus continuing on resentment and forgiveness. Clinician and group members completed worksheet identifying emotions linked with resentment, thoughts about self and others developed based on events supporting resentment. Clinician facilitated Empty Chair activity walking through what each group member is getting out of holding onto resentment and the changes in thought patterns resulting from forgiveness. Clinician and clients discussed acceptance vs forgiveness for situations out of control.     Summary: Client presents stating finding recently attended [place] meetings not helpful and not connecting with others despite understanding the importance of finding a sponsor or temporary sponsor. Client reports plans to move out of the [facility] and back with his mother due to events earlier in the week despite previously noting staying at the [facility] was better for his sobriety  than staying with his mother. Client states frustration with unknown rules but did not meeting with other house members to discuss this concern. Client reports some resentment related to this but does not believe this is a situation requiring forgiveness, only action.   Family Program: Family present? No   Name of family member(s): NA client good candidate for family therapy with mother  UDS collected: No Results: none  AA/NA attended?: Yes  Sponsor?: No   Olegario Messier, LCSW

## 2019-10-27 ENCOUNTER — Other Ambulatory Visit (HOSPITAL_COMMUNITY): Payer: Self-pay | Admitting: Medical

## 2019-10-27 MED ORDER — ACAMPROSATE CALCIUM 333 MG PO TBEC: 666.0000 mg | DELAYED_RELEASE_TABLET | Freq: Three times a day (TID) | ORAL | 0 refills | Status: DC

## 2019-10-27 MED ORDER — LISINOPRIL 2.5 MG PO TABS: 2.5000 mg | ORAL_TABLET | Freq: Every day | ORAL | 0 refills | Status: DC

## 2019-10-27 NOTE — Progress Notes (Signed)
rx refill request done

## 2019-10-29 ENCOUNTER — Other Ambulatory Visit: Payer: Self-pay

## 2019-10-29 ENCOUNTER — Other Ambulatory Visit (HOSPITAL_COMMUNITY): Payer: BC Managed Care – PPO | Admitting: Licensed Clinical Social Worker

## 2019-10-29 DIAGNOSIS — F319 Bipolar disorder, unspecified: Secondary | ICD-10-CM | POA: Diagnosis not present

## 2019-10-29 DIAGNOSIS — F1020 Alcohol dependence, uncomplicated: Secondary | ICD-10-CM

## 2019-10-30 NOTE — Progress Notes (Signed)
    Daily Group Progress Note  Program: CD-IOP   Group Time: 1pm-2:30pm Participation Level: Active Behavioral Response: Appropriate Type of Therapy: Process Group  Topic: Clinician met with group assessing SI/HI/psychosis and overall level of functioning including cravings or relapse. Clinician inquired about sobriety dates, community meetings attended and what supported and challenged recovery over the weekend. Clinician processed with clients recent experiences with different family members and friends' responses to relapse, both supportive and unsupportive and related thoughts and feelings. Clinician and group members processed changing in relationship dynamics between active use and early recovery.  Group Time: 2:30pm-4pm Participation Level: Active Behavioral Response: Appropriate Type of Therapy: Psycho-education Group  Topic: Clinician presented the psycho-educational topic of Willingness vs Willfulness. Clinician and group members defined willingness and willfulness and recent moments of each related to addiction and sobriety. Clinician provided supplemental video on 'Willingness as an Antidote to Anxiety.' Clinician and group members discussed the practice of sitting in uncomfortable emotions or physical sensations and resisting the urge to act impulsively, specifically in relation to cravings in early recovery. Group members completed activity presented in video and clinician reviewed additional skill of Half-smiling and Willing hands   Clinician and group celebrated graduation of one member providing supportive feedback to support recovery.    Summary: Client presented to group with reported sobriety date of 09/23/19. Client shared changes in mood and behavior since leaving [facility] to move back in with his mother. Client repored concern with mother's lawyer and also for himself that she continues to talk about this topic. Client shared several positive self care behaviors  including walking outside multiple times and spending time with supportive family members. Client presented as willful in several situations at home and was sarcastic rather an receptive to group feedback. Client shows progress toward goals AEB maintaining sobriety and attending [place] meetings, continued work is needed on getting a sponsor and managing stressors in the home.   Family Program: Family present? No   Name of family member(s): N/A. Would benefit from family therapy with mother.  UDS collected: Yes Results: pending  AA/NA attended?: Yes; 2 virtual meetings attended since group Thursday.  Sponsor?: No   Olegario Messier, LCSW

## 2019-10-31 ENCOUNTER — Other Ambulatory Visit (HOSPITAL_COMMUNITY): Payer: Self-pay | Admitting: Medical

## 2019-10-31 ENCOUNTER — Other Ambulatory Visit (HOSPITAL_COMMUNITY): Payer: BC Managed Care – PPO

## 2019-10-31 ENCOUNTER — Encounter (HOSPITAL_COMMUNITY): Payer: Self-pay | Admitting: Medical

## 2019-10-31 ENCOUNTER — Other Ambulatory Visit: Payer: Self-pay

## 2019-10-31 MED ORDER — HYDROXYZINE HCL 25 MG PO TABS: 25.0000 mg | ORAL_TABLET | Freq: Three times a day (TID) | ORAL | 2 refills | Status: AC | PRN

## 2019-10-31 MED ORDER — BUPROPION HCL ER (XL) 300 MG PO TB24: 300.0000 mg | ORAL_TABLET | ORAL | 2 refills | Status: DC

## 2019-10-31 MED ORDER — ACAMPROSATE CALCIUM 333 MG PO TBEC: 666.0000 mg | DELAYED_RELEASE_TABLET | Freq: Three times a day (TID) | ORAL | 0 refills | Status: AC

## 2019-10-31 NOTE — Progress Notes (Signed)
Medications reordered due EPIC changing rx from normal to print?!

## 2019-11-01 ENCOUNTER — Other Ambulatory Visit (HOSPITAL_COMMUNITY): Payer: BC Managed Care – PPO | Admitting: Licensed Clinical Social Worker

## 2019-11-01 ENCOUNTER — Other Ambulatory Visit: Payer: Self-pay

## 2019-11-01 DIAGNOSIS — F1411 Cocaine abuse, in remission: Secondary | ICD-10-CM

## 2019-11-01 DIAGNOSIS — F1020 Alcohol dependence, uncomplicated: Secondary | ICD-10-CM

## 2019-11-01 DIAGNOSIS — F319 Bipolar disorder, unspecified: Secondary | ICD-10-CM | POA: Diagnosis not present

## 2019-11-01 DIAGNOSIS — F1211 Cannabis abuse, in remission: Secondary | ICD-10-CM

## 2019-11-01 DIAGNOSIS — F1094 Alcohol use, unspecified with alcohol-induced mood disorder: Secondary | ICD-10-CM

## 2019-11-01 DIAGNOSIS — Z6372 Alcoholism and drug addiction in family: Secondary | ICD-10-CM

## 2019-11-05 ENCOUNTER — Other Ambulatory Visit (HOSPITAL_COMMUNITY): Payer: BC Managed Care – PPO | Admitting: Licensed Clinical Social Worker

## 2019-11-05 ENCOUNTER — Other Ambulatory Visit: Payer: Self-pay

## 2019-11-05 DIAGNOSIS — F319 Bipolar disorder, unspecified: Secondary | ICD-10-CM | POA: Diagnosis not present

## 2019-11-05 DIAGNOSIS — F3181 Bipolar II disorder: Secondary | ICD-10-CM

## 2019-11-05 DIAGNOSIS — F1020 Alcohol dependence, uncomplicated: Secondary | ICD-10-CM

## 2019-11-05 DIAGNOSIS — F1094 Alcohol use, unspecified with alcohol-induced mood disorder: Secondary | ICD-10-CM

## 2019-11-06 NOTE — Progress Notes (Signed)
    Daily Group Progress Note  Program: CD-IOP   Group Time: 1pm-2:30pm Participation Level: Active Behavioral Response: Appropriate Type of Therapy: Process Group Topic:  Clinician checked in with group members, assessing for SI/HI/psychosis and overall level of functioning. Clinician and group members processed moments of success and barriers to maintaining recovery since previous group. Clinician and group members read and processed Daily Reflection and how it reflects on current place in recovery.   Group Time: 2:30pm-4pm Participation Level: Active Behavioral Response: Appropriate Type of Therapy: Psycho-education Group Topic: Clinician presented the topic of Kristin Neff's self-compassion and skills focused on supporting positive view of self, avoiding excessive self-criticism, and finding common humanity using gratitude and self-soothing to cope with difficult situations with limited control. Clinician and group members reviewed differences in self-esteem vs self-compassion. Group members discussed common humanity and suffering in the context of engaging with AA groups. Clinician presented self-esteem journal encouraging clients to use this as a tool to identify positive moments and moments of gratitude throughout the week.   Summary: Client engaged in group discussions and activities appropriately. Client identifiee meditation as interesting and would be willing to complete outside of group. Client stated unable to walk outside this weekend due to weather but did engage in virtual meetings. Client shows progress toward goals AEB maintaining sobriety. Client currently struggles with finding a sponsor. Client will continue to work on goals for assertive communication and anger management.   Family Program: Family present? No   Name of family member(s): NA  UDS collected: No   AA/NA attended?: Yes 2 virtual meetings  Sponsor?: No   [name], LCSW

## 2019-11-06 NOTE — Progress Notes (Signed)
    Daily Group Progress Note  Program: CD-IOP   Group Time: 1pm-2:30pm Participation Level: Active Behavioral Response: Appropriate ype of TherapyT: Process Group   Topic: Clinician and group members checked in with assessment of SI/HI/psychosis and overall level of functioning, including difficulty managing recovery and use of skills and support system. Clinician and group members read and processed Reflections of the day. Clinician and group members processed thoughts and feelings related to access to [facility] and SUD treatment and barriers clients experienced seeking treatment.     Group Time: 2:30pm-4pm Participation Level: Active Behavioral Response: Appropriate Type of Therapy: Psycho-education Group   Topic: Clinician presented psycho-educational topic of Love Languages. Clinician provided group members with screening and implementation of different types of ways to interact based on partners language. Clinician and group members discussed the importance in healthy communication in a relationship. Clinician provided clients with list of ways to incorporate love languages with self-care and self-compassion. Clinician provided clients with brief list of coping skills and when each might be helpful or not helpful based on examples.   Summary: Client engaged in group discussions, maintaining sobriety date of 09/23/19. Client updated group on recent stressors in and outside of the home. Client shows progress toward goals by maintaining abstinence and engaging in 12 step groups. Client was able to identify previous relationships where there were different love languages, sometimes making him feel unappreciated for behaviors.   Family Program: Family present? No   Name of family member(s): NA  UDS collected: No Results: prescription medications only  AA/NA attended?: No  Sponsor?: No   [name], LCSW

## 2019-11-07 ENCOUNTER — Other Ambulatory Visit (INDEPENDENT_AMBULATORY_CARE_PROVIDER_SITE_OTHER): Payer: BC Managed Care – PPO | Admitting: Licensed Clinical Social Worker

## 2019-11-07 ENCOUNTER — Encounter (HOSPITAL_COMMUNITY): Payer: Self-pay | Admitting: Medical

## 2019-11-07 ENCOUNTER — Other Ambulatory Visit: Payer: Self-pay

## 2019-11-07 DIAGNOSIS — Z6372 Alcoholism and drug addiction in family: Secondary | ICD-10-CM

## 2019-11-07 DIAGNOSIS — F1994 Other psychoactive substance use, unspecified with psychoactive substance-induced mood disorder: Secondary | ICD-10-CM

## 2019-11-07 DIAGNOSIS — Z8659 Personal history of other mental and behavioral disorders: Secondary | ICD-10-CM

## 2019-11-07 DIAGNOSIS — Z8739 Personal history of other diseases of the musculoskeletal system and connective tissue: Secondary | ICD-10-CM

## 2019-11-07 DIAGNOSIS — I10 Essential (primary) hypertension: Secondary | ICD-10-CM

## 2019-11-07 DIAGNOSIS — F1020 Alcohol dependence, uncomplicated: Secondary | ICD-10-CM

## 2019-11-07 DIAGNOSIS — E669 Obesity, unspecified: Secondary | ICD-10-CM

## 2019-11-07 DIAGNOSIS — F1411 Cocaine abuse, in remission: Secondary | ICD-10-CM

## 2019-11-07 DIAGNOSIS — F1211 Cannabis abuse, in remission: Secondary | ICD-10-CM

## 2019-11-07 DIAGNOSIS — G479 Sleep disorder, unspecified: Secondary | ICD-10-CM

## 2019-11-07 DIAGNOSIS — F319 Bipolar disorder, unspecified: Secondary | ICD-10-CM | POA: Diagnosis not present

## 2019-11-07 MED ORDER — HYDROCHLOROTHIAZIDE 25 MG PO TABS: 25.0000 mg | ORAL_TABLET | Freq: Every day | ORAL | 0 refills | Status: AC

## 2019-11-07 MED ORDER — LISINOPRIL 2.5 MG PO TABS: 2.5000 mg | ORAL_TABLET | Freq: Every day | ORAL | 0 refills | Status: AC

## 2019-11-07 NOTE — Progress Notes (Signed)
[facility] Follow-up Outpatient CDIOP Date: 11/07/2019  Admission Date: 10/08/2019  Sobriety date:09/23/2019  Subjective:  "I'm good"  HPI  CD IOP Provider FU Pt is now about 6 weeks away from last drink/intoxication. 2 weeks agohe left the [facility] and moved back in with his mother. Move was precipitated by [facility] house requiring he get a job and a sponsor neither of which he wanted to do. He says there is no drinking in the house as mom stopped.Sobriety without triggers-he isnt sur how he [id number] He is reported to have said to Counselors that he wants to wait on the job search until he finishes CD IOP. He says he has not had an opportunity to meet/find a sponsor because he is only doing [employer]. He wishes that the live meeting near house in [place] was still meeting. He reports he is having trouble taking Campral in middle of day.He is uncertain of its effectiveness.Wondering if he can take PRN? He hasnt gotten his BP meds? He didnt get Psych meds because EPIC had changed from Normal to Print (probably due to inpt status) and Pharmacy was [facility] of [facility]?!? This was corrected and he reports satifaction and efficacy with the Wellbutrin he only has to take once a day.Marland Kitchen He did not tolerate the Vistaril as it made him sleepy in middle of the day.  Review of Systems: Psychiatric: Agitation: reported to be about meds and with Mom at home Hallucination: No Depressed Mood: responding to Wellbutrin Insomnia: No Hypersomnia: with Vistaril Altered Concentration: No Feels Worthless: No Grandiose Ideas: No Belief In Special Powers: No New/Increased Substance Abuse: No Compulsions: No  Neurologic: Headache: No Seizure: No Paresthesias: No  Current Medications: acamprosate 333 MG tablet Commonly known as: CAMPRAL Take 2 tablets (666 mg total) by mouth 3 (three) times daily with meals. For alcoholism  buPROPion 300 MG 24 hr  tablet Commonly known as: Wellbutrin XL Take 1 tablet (300 mg total) by mouth every morning.  colchicine 0.6 MG tablet Take by mouth.  hydrochlorothiazide 25 MG tablet Commonly known as: HYDRODIURIL Take 1 tablet (25 mg total) by mouth daily. For high blood pressure  hydrOXYzine 25 MG tablet Commonly known as: ATARAX/VISTARIL Take 1 tablet (25 mg total) by mouth 3 (three) times daily as needed for anxiety.  lisinopril 2.5 MG tablet Commonly known as: ZESTRIL Take 1 tablet (2.5 mg total) by mouth daily. For high blood pressure  olopatadine 0.1 % ophthalmic solution Commonly known as: PATANOL Place 1 drop into both eyes 2 times daily for 10 days   Mental Status Examination  Appearance:Cas Alert: Yes Attention: good  Cooperative: Yes Eye Contact: Good Speech: Clear and coherent Psychomotor Activity: Normal Memory/Concentration: Normal/intact Oriented: person, place, time/date and situation Mood: Variable Affect: Appropriate and Congruent Thought Processes and Associations: Coherent and Intact Fund of Knowledge: WDL Thought Content: WDL Insight: Limited Judgement: Impaired  UDS: 1/25&10/22/2019 PDMP nothing since 2019  Diagnosis:  0 Alcohol use disorder, severe, dependence ([name]) 0 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 Discussed triggers and CBT ways of dealing with them.Emphasoized "If you dont WANT to slip;Dont  go inti slippery places"  Patient ID: Luis Acosta, male   DOB: [birth year], 34 y.o.   MRN:  [id number] Darlyne Russian, PA-C

## 2019-11-08 ENCOUNTER — Other Ambulatory Visit (HOSPITAL_COMMUNITY): Payer: BC Managed Care – PPO | Admitting: Licensed Clinical Social Worker

## 2019-11-08 ENCOUNTER — Other Ambulatory Visit: Payer: Self-pay

## 2019-11-08 DIAGNOSIS — F1094 Alcohol use, unspecified with alcohol-induced mood disorder: Secondary | ICD-10-CM

## 2019-11-08 DIAGNOSIS — F319 Bipolar disorder, unspecified: Secondary | ICD-10-CM | POA: Diagnosis not present

## 2019-11-08 DIAGNOSIS — F1020 Alcohol dependence, uncomplicated: Secondary | ICD-10-CM

## 2019-11-11 NOTE — Progress Notes (Signed)
    Daily Group Progress Note  Program: CD-IOP   Group Time: 1pm-2:30pm  Participation Level: Active  Behavioral Response: Appropriate  Type of Therapy: Psycho-education Group  Topic: Clinician met with group members, assessing for SI/HI/psychosis Clinician checked in with group members, assessing for SI/HI/psychosis and overall level of functioning including relapse and barriers to recovery. Clinician and group members discussed highlights and struggles since last group. Psycho-educational facilitated by pharmacist from [facility] Pharmacist. Clients were provided time to ask questions about medications and pharmacist provided general psychoeducation on classes of medications and uses, including medications to help with cravings. Clinician provided mindfulness activity.   Group Time: 2:30pm-4pm  Participation Level: Active  Behavioral Response: Appropriate  Type of Therapy: Process Group  Topic: Clinician presented psycho-education on "Four Most Common Reasons for Relapse in Early Recovery." Clinician and group discussed physical cravings, psychological longing, physical pain, and emotional pain as triggers for thoughts followed by possible cravings and use. Clinician presented DBT Distress Tolerance handout on Addict vs Clean vs Clear Mind. Clinician inquired about self care activity to support recovery before next group.   Summary: Client maintains initial sobriety date. Client verbalized some concern with getting medications sent to pharmacy. Client inquired about side effects of anti-anxiety and anti-craving medications. Client was able to identify previous triggers for relapse and specific people/places/things to avoid. Client was able to share some insight into previous reasons for slips by not focusing on recovery. Client states knowing he needs to work more on engaging with AA. Client shows progress toward goals AEB continuing to improve physical health.   Family  Program: Family present? No   Name of family member(s): NA  UDS collected: No Results: prescription medications only  AA/NA attended?: Yes; zoom meetings. Client reports not feeling these as effective as face to face meetings.  Sponsor?: No   Olegario Messier, LCSW

## 2019-11-12 ENCOUNTER — Other Ambulatory Visit (HOSPITAL_COMMUNITY): Payer: BC Managed Care – PPO | Admitting: Licensed Clinical Social Worker

## 2019-11-12 ENCOUNTER — Other Ambulatory Visit: Payer: Self-pay

## 2019-11-12 DIAGNOSIS — F1020 Alcohol dependence, uncomplicated: Secondary | ICD-10-CM

## 2019-11-12 DIAGNOSIS — F3181 Bipolar II disorder: Secondary | ICD-10-CM

## 2019-11-12 DIAGNOSIS — F1211 Cannabis abuse, in remission: Secondary | ICD-10-CM

## 2019-11-12 DIAGNOSIS — Z6372 Alcoholism and drug addiction in family: Secondary | ICD-10-CM

## 2019-11-12 DIAGNOSIS — F1994 Other psychoactive substance use, unspecified with psychoactive substance-induced mood disorder: Secondary | ICD-10-CM

## 2019-11-12 DIAGNOSIS — F1411 Cocaine abuse, in remission: Secondary | ICD-10-CM

## 2019-11-12 DIAGNOSIS — F319 Bipolar disorder, unspecified: Secondary | ICD-10-CM | POA: Diagnosis not present

## 2019-11-14 ENCOUNTER — Encounter (HOSPITAL_COMMUNITY): Payer: BC Managed Care – PPO

## 2019-11-15 ENCOUNTER — Other Ambulatory Visit: Payer: Self-pay

## 2019-11-15 ENCOUNTER — Other Ambulatory Visit (HOSPITAL_COMMUNITY): Payer: BC Managed Care – PPO

## 2019-11-18 NOTE — Progress Notes (Signed)
    Daily Group Progress Note  Program: CD-IOP   Group Time: 1pm-2:30pm  Participation Level: Active Behavioral Response: Appropriate Type of Therapy: Process Group    Topic: Clinician met with clients, assessing for SI/HI/psychosis and overall level of functioning including attendance of recovery meetings and relapse or challenges to sobriety. Clinician and group members discussed highs and lows from previous days and reflection on topic from previous day. Clinician and group members read Daily Reflection and clinician facilitated discussion on topic related to recovery.    Group Time: 2:30pm-4pm Participation Level: Active  Behavioral Response: Appropriate Type of Therapy: Psycho-education Group  Topic: Clinician provided psycho-educational group on PAWS. Clinician provided supplemental video with discussion on common PAWS symptoms and skills to help with management. Clinician utilized Post Acute Withdrawal (PAW) Self-Evaluation developed by Patrina Levering. Clinician and group members discussed the importance of being aware and tracking symptoms during recovery. Clinician provided supplemental video of information related to PAWS, common triggers, and relaxation techniques for management of symptoms. Clinician provided exercises for mindfulness as an alternative to meditations.    Summary: Client reports maintaining sobriety date of 09/23/19. Client engaged in discussions with group members and identified previous possible PAWS symptoms form having so sobrer time. Client reported continued exercise and eating healthy to help support new lifestyle. Client shows progress toward goals AEB improving physical health and mental health with some meditation. Client has some challenges with continued isolation and limited planning for behaviors/activities following discharge from group.   Family Program: Family present? No   Name of family member(s): NA  UDS collected: Yes Results:  Positive  for prescribed welbutrin  AA/NA attended?: Yes; 1 mtg  Sponsor?: No; identifies the importance of having a sponsor   Olegario Messier, LCSW

## 2019-11-19 ENCOUNTER — Other Ambulatory Visit: Payer: Self-pay

## 2019-11-19 ENCOUNTER — Other Ambulatory Visit (HOSPITAL_COMMUNITY): Payer: BC Managed Care – PPO | Attending: Psychiatry | Admitting: Licensed Clinical Social Worker

## 2019-11-19 DIAGNOSIS — Z79899 Other long term (current) drug therapy: Secondary | ICD-10-CM | POA: Insufficient documentation

## 2019-11-19 DIAGNOSIS — I10 Essential (primary) hypertension: Secondary | ICD-10-CM | POA: Insufficient documentation

## 2019-11-19 DIAGNOSIS — F1211 Cannabis abuse, in remission: Secondary | ICD-10-CM | POA: Insufficient documentation

## 2019-11-19 DIAGNOSIS — E669 Obesity, unspecified: Secondary | ICD-10-CM | POA: Diagnosis not present

## 2019-11-19 DIAGNOSIS — Z6372 Alcoholism and drug addiction in family: Secondary | ICD-10-CM

## 2019-11-19 DIAGNOSIS — Z560 Unemployment, unspecified: Secondary | ICD-10-CM | POA: Diagnosis not present

## 2019-11-19 DIAGNOSIS — Z811 Family history of alcohol abuse and dependence: Secondary | ICD-10-CM | POA: Insufficient documentation

## 2019-11-19 DIAGNOSIS — F1094 Alcohol use, unspecified with alcohol-induced mood disorder: Secondary | ICD-10-CM

## 2019-11-19 DIAGNOSIS — Z6835 Body mass index (BMI) 35.0-35.9, adult: Secondary | ICD-10-CM | POA: Insufficient documentation

## 2019-11-19 DIAGNOSIS — F1020 Alcohol dependence, uncomplicated: Secondary | ICD-10-CM | POA: Diagnosis not present

## 2019-11-19 DIAGNOSIS — F319 Bipolar disorder, unspecified: Secondary | ICD-10-CM | POA: Insufficient documentation

## 2019-11-19 DIAGNOSIS — F1411 Cocaine abuse, in remission: Secondary | ICD-10-CM | POA: Insufficient documentation

## 2019-11-19 DIAGNOSIS — Z7901 Long term (current) use of anticoagulants: Secondary | ICD-10-CM | POA: Insufficient documentation

## 2019-11-19 DIAGNOSIS — R0683 Snoring: Secondary | ICD-10-CM | POA: Diagnosis not present

## 2019-11-20 NOTE — Progress Notes (Signed)
Virtual Visit via Video Note  I connected with Luis Acosta on 11/08/2019 at  1:00 PM EST by a video enabled telemedicine application and verified that I am speaking with the correct person using two identifiers.   I discussed the limitations of evaluation and management by telemedicine and the availability of in person appointments. The patient expressed understanding and agreed to proceed.  I discussed the assessment and treatment plan with the patient. The patient was provided an opportunity to ask questions and all were answered. The patient agreed with the plan and demonstrated an understanding of the instructions.   The patient was advised to call back or seek an in-person evaluation if the symptoms worsen or if the condition fails to improve as anticipated.  I provided 180 minutes of non-face-to-face time during this encounter.   Olegario Messier, LCSW     Daily Group Progress Note  Program: CD-IOP   Group Time: 1pm-2pm Participation Level: Active Behavioral Response: Appropriate Type of Therapy: Process Group     Topic: Clinician checked in with group members, assessing for SI/HI/psychosis and overall level of functioning including relapse and barriers to recovery. Clinician and group members discussed highlights and struggles since last group related to maintaining sobriety including identified triggers and responses. Clinician and group members processed Daily Reflection and  personal meeting to current work in recovery. Clinician provided mindfulness activity.       Group Time: 2pm-4pm Participation Level: Active Behavioral Response: Appropriate Type of Therapy: Psycho-education Group   Topic: Clinician and group members reviewed accomplishments and needs assessment, including physical, emotional, cognitive, and social needs. Clinician and group members discussed what it looks like to have needs met, and what it looks like when their personal needs are not met.  Clinician facilitated discussion on self-awareness of feelings, unmet needs, and fears related to asking for help. Clinician facilitated discussion on meeting needs in healthy vs unhealthy way. Clinician and group members discussed breaking unhealthy patterns of getting needs met and provided examples of helpful and unhelpful needs instructions.    Summary: Client presented appearance WNL, congruent mood and affect, cooperative and engaged in discussions and activities. Client shows progress toward goals AEB maintaining sobriety date of 09/23/19. Client reported continuing working on physical health to provide support for his recovery. Client was able to identify which activities he previously stopped doing prior to relapse. Client plans to continue self care by attending [place] meetings, acknowledging the need for a sponsor.   Family Program: Family present? No   Name of family member(s): NA  UDS collected: No Results: NA AA/NA attended?: Yes  Sponsor?: No   Olegario Messier, LCSW

## 2019-11-20 NOTE — Progress Notes (Signed)
    Daily Group Progress Note  Program: CD-IOP   Group Time: 1pm-2:30pm psycho-education, 2:30pm-4pm process  Participation Level: Active  Behavioral Response: Appropriate  Type of Therapy: psycho-educational group, process group  Topic: The purpose of this group is to utilize CBT and DBT skills to increase use of healthy coping skills to eliminate substance use and support recovery.  Psycho-educational session co-facilitated by [facility] focused on use of yoga to promote the use of mindfulness as a skill for recovery. Clinician and group members participated in session skills presented. Clinician praised client use of engagement in activity and challenged clients to use this skill and body awareness to help monitor symptoms of stress. Clinician and group members reviewed and discussed crisis planning, including identifying triggers, distraction sills, support systems and resources. UDS completed on all clients.  Process session focused on recent stressors and barriers to recovery. Clinician checked in with group members, assessing for SI/HI/psychosis and overall level of functioning including relapse and cravings. Clinician and group members discussed challenges and successes with family dynamics, engagement with community support systems, and skills used to address interactions, including assertive communication or distraction skills. Clinician challenged group members to continue enforcing boundaries despite level of comfort or discomfort. Clinician inquired self care activity to complete prior to next group session.    Summary: Client presented casually dressed with congruent mood and affect. Client engaged in psycho-educational mindfulness activity successfully. Client notes it has bee helpful adding meditation, healthy eating, and physical activity into his daily life. Client shows progress toward goals AEB maintaining sobriety date of 09/23/19. Client processed family dynamics and parents  discussing each other. Client verbalized worry about upcoming court dates and probation limiting opportunities.   Family Program: Family present? No   Name of family member(s): NA  UDS collected: Yes Results: no substances found I previous UDS; results from today pending  AA/NA attended?: Yes 2  Sponsor?: No   [name], LCSW

## 2019-11-21 ENCOUNTER — Other Ambulatory Visit: Payer: Self-pay

## 2019-11-21 ENCOUNTER — Ambulatory Visit (HOSPITAL_COMMUNITY): Payer: BC Managed Care – PPO | Admitting: Licensed Clinical Social Worker

## 2019-11-21 ENCOUNTER — Other Ambulatory Visit (HOSPITAL_COMMUNITY): Payer: BC Managed Care – PPO | Admitting: Licensed Clinical Social Worker

## 2019-11-21 DIAGNOSIS — F319 Bipolar disorder, unspecified: Secondary | ICD-10-CM | POA: Diagnosis not present

## 2019-11-21 DIAGNOSIS — Z8659 Personal history of other mental and behavioral disorders: Secondary | ICD-10-CM

## 2019-11-21 DIAGNOSIS — F1211 Cannabis abuse, in remission: Secondary | ICD-10-CM

## 2019-11-21 DIAGNOSIS — F1020 Alcohol dependence, uncomplicated: Secondary | ICD-10-CM

## 2019-11-21 DIAGNOSIS — F1411 Cocaine abuse, in remission: Secondary | ICD-10-CM

## 2019-11-21 DIAGNOSIS — I10 Essential (primary) hypertension: Secondary | ICD-10-CM

## 2019-11-21 DIAGNOSIS — G479 Sleep disorder, unspecified: Secondary | ICD-10-CM

## 2019-11-21 DIAGNOSIS — Z8739 Personal history of other diseases of the musculoskeletal system and connective tissue: Secondary | ICD-10-CM

## 2019-11-21 DIAGNOSIS — F1994 Other psychoactive substance use, unspecified with psychoactive substance-induced mood disorder: Secondary | ICD-10-CM

## 2019-11-21 DIAGNOSIS — E669 Obesity, unspecified: Secondary | ICD-10-CM

## 2019-11-21 DIAGNOSIS — Z6372 Alcoholism and drug addiction in family: Secondary | ICD-10-CM

## 2019-11-21 MED ORDER — BUPROPION HCL ER (XL) 300 MG PO TB24: 300.0000 mg | ORAL_TABLET | ORAL | 0 refills | Status: AC

## 2019-11-21 NOTE — Progress Notes (Deleted)
   [facility] Follow-up Outpatient CDIOP Date: 11/20/2020                                   DUPLICATE NOTE   Maryjean Morn, PA-CPatient ID: Luis Acosta, male   DOB: 1986-04-02, 34 y.o.   MRN: [id number]

## 2019-11-22 ENCOUNTER — Other Ambulatory Visit: Payer: Self-pay

## 2019-11-22 ENCOUNTER — Other Ambulatory Visit (HOSPITAL_COMMUNITY): Payer: BC Managed Care – PPO | Admitting: Licensed Clinical Social Worker

## 2019-11-22 DIAGNOSIS — F1994 Other psychoactive substance use, unspecified with psychoactive substance-induced mood disorder: Secondary | ICD-10-CM

## 2019-11-22 DIAGNOSIS — F1020 Alcohol dependence, uncomplicated: Secondary | ICD-10-CM

## 2019-11-22 DIAGNOSIS — F319 Bipolar disorder, unspecified: Secondary | ICD-10-CM | POA: Diagnosis not present

## 2019-11-22 DIAGNOSIS — F1211 Cannabis abuse, in remission: Secondary | ICD-10-CM

## 2019-11-22 DIAGNOSIS — Z6372 Alcoholism and drug addiction in family: Secondary | ICD-10-CM

## 2019-11-22 NOTE — Progress Notes (Signed)
   THERAPIST PROGRESS NOTE  Session Time: 4:10pm-4:40pm  Participation Level: Active  Behavioral Response: NeatAlertIrritable  Type of Therapy: Individual Therapy  Treatment Goals addressed: Coping, Maintaining sobriety  Interventions: CBT, Motivational Interviewing  Summary: Client presented alert and oriented x5 for today's session. Client presented guarded when discussion initiated on stressors related to his parent's divorce and his ability to set boundaries. Client reported that he feels as though he needs to assist his mother through these issues however also identified that this can be a stressor at times. Client questioned clinician's intentions in prompting this discussion and reported that once he is finished with this program "I won't have anyone asking if I'm sober still". Client relayed that establishing a network in AA/sponsor can be helpful for a layer of accountability however continued to relay minimal motivation to seek out meetings. Client became frustrated when asked his plans following d/c from this program specifically his intentions or motivation in obtaining employment. Client agreed to contact [facility] by next Monday in order to arrange an outpatient therapy appointment to continue therapeutic services. Client shows progress towards goals AEB maintaining sobriety date of 09/23/19.   Suicidal/Homicidal: Nowithout intent/plan  Therapist Response: Clinician met with client and assessed for recent stressors or significant changes in mood. Clinician validated client's feelings and prompted discussion on potential action client can take to set boundaries within his family to decrease stress, co-dependency, and maintain sobriety. Clinician utilized open ended questions related to client's motivation to obtain employment and create routine following his d/c from this program. Clinician utilized CBT to challenge client's thoughts related to his role within the family  dynamic and needing to "keep the peace". Clinician assigned therapeutic homework in client contacting [facility] to arrange an outpatient therapy appointment following d/c from [place].   Plan: Return again in around 2 weeks.  Diagnosis: Axis I: Alcohol use disorder, severe, dependence ([place]), Dysfunctional Family Due to [place], LCSW 11/22/2019

## 2019-11-22 NOTE — Progress Notes (Signed)
   THERAPIST PROGRESS NOTE  Session Time: 4:00pm-4:45pm  Participation Level: Active  Behavioral Response: NeatAlertEuthymic  Type of Therapy: Individual Therapy  Treatment Goals addressed: Coping  Interventions: CBT, Motivational Interviewing, Supportive  Summary: Clinician met with Luis Acosta for an individual therapy session. Clinician initiated check in and inquired as to Luis Acosta's experience in CD-IOP today. Clinician introduced a timeline activity and prompted Luis Acosta to identify significant events throughout her life to provide insight into his experiences, treatment, and relationship history. Clinician utilized open ended questions and summarizations to prompt Luis Acosta to increase vulnerability throughout session. Clinician validated Luis Acosta's experiences and statements in describing his personal history, struggles, and goals. Clinician asked Luis Acosta to identify 3 specific goals that he would like to work towards throughout individual therapy.   Suicidal/Homicidal: Nowithout intent/plan   Therapist Response: Client presented alert and oriented for this session immediately following CD-IOP today. Client was open and willing to engage in the timeline activity and discussed significant events such as his experience with his family of origin and identified his history with substance abuse and various stays within detox and residential treatment settings. Luis Acosta reported that he moved throughout various schools growing up due to his difficulty learning and reported this has greatly impacted his ability and confidence in establishing friendships. Luis Acosta provided insight into his current supports which he identifies as his mother and sister however he interacts with his mother more frequently. Luis Acosta agreed to meet again in 2 weeks and to identify 3 treatment goals for individual therapy during our next meeting together.   Plan: Return again in 2-3 weeks.  Diagnosis: Axis I: Alcohol use disorder, severe, dependence  ([place])       Renee Harder, MSW, LCSW 11/22/2019

## 2019-11-26 ENCOUNTER — Other Ambulatory Visit (HOSPITAL_COMMUNITY): Payer: BC Managed Care – PPO | Admitting: Licensed Clinical Social Worker

## 2019-11-26 ENCOUNTER — Encounter (HOSPITAL_COMMUNITY): Payer: Self-pay | Admitting: Medical

## 2019-11-26 ENCOUNTER — Other Ambulatory Visit: Payer: Self-pay

## 2019-11-26 DIAGNOSIS — F1094 Alcohol use, unspecified with alcohol-induced mood disorder: Secondary | ICD-10-CM

## 2019-11-26 DIAGNOSIS — F1020 Alcohol dependence, uncomplicated: Secondary | ICD-10-CM

## 2019-11-26 DIAGNOSIS — F319 Bipolar disorder, unspecified: Secondary | ICD-10-CM | POA: Diagnosis not present

## 2019-11-26 NOTE — Progress Notes (Signed)
   [facility] Follow-up Outpatient CDIOP Date: 11/21/2019  Admission Date:10/08/2019  Sobriety date:09/23/2019  Subjective: " Wellbutrin seems to be working"  HPI: CD IOP Routine provider FU visit Pt has moved back in with mother and is dependent on her for rides to meetings so he has cut back on in person meetings. He admits he has not substituted online meetings. He is due a 60 day chip (Congratulations).He has not gotten a sponsor for much the same reasons. He reports good response to Wellbutrin. He is putting job etc on hold until he finishes treatment.  Review of Systems: Psychiatric: Agitation: No Hallucination: No Depressed Mood: Yes much better Insomnia: No Hypersomnia: No Altered Concentration: No Feels Worthless: No Grandiose Ideas: No Belief In Special Powers: No New/Increased Substance Abuse: No Compulsions: No  Neurologic: Headache: No Seizure: No Paresthesias: No  Current Medications: acamprosate 333 MG tablet Commonly known as: CAMPRAL Take 2 tablets (666 mg total) by mouth 3 (three) times daily with meals. For alcoholism  buPROPion 300 MG 24 hr tablet Commonly known as: Wellbutrin XL Take 1 tablet (300 mg total) by mouth every morning.  colchicine 0.6 MG tablet Take by mouth.  hydrochlorothiazide 25 MG tablet Commonly known as: HYDRODIURIL Take 1 tablet (25 mg total) by mouth daily. For high blood pressure  hydrOXYzine 25 MG tablet Commonly known as: ATARAX/VISTARIL Take 1 tablet (25 mg total) by mouth 3 (three) times daily as needed for anxiety.  lisinopril 2.5 MG tablet Commonly known as: ZESTRIL Take 1 tablet (2.5 mg total) by mouth daily. For high blood pressure  olopatadine 0.1 % ophthalmic solution Commonly known as: PATANOL Place 1 drop into both eyes 2 times daily for 10 days    Mental Status Examination  Appearance:Casual;well groomed Alert: Yes Attention: good  Cooperative: Yes Eye Contact: Good Speech: Clear and  coherent Psychomotor Activity: Normal Memory/Concentration: Normal/intact Oriented: person, place, time/date and situation Mood: Euthymic PHQ 2 11/19/2019 0 Affect: Appropriate and Congruent Thought Processes and Associations: Coherent and Intact.Codependent Fund of Knowledge: Limited Thought Content: WDL Insight: Limited Judgement: Limited  QAS:TMHDQ PDMP: Unable to review Diagnosis:   Assessment: SUDs in early remission in treatment Depending on circumstances to act rather than acting to adress circumstances (Co Dependent)  Treatment Plan:Continue per admission.Encouraged to seek temporary sponsor.Encouraged to attend in person meetings and ask for rides from peers in AA FU 2 weeks-sooner if needed Maryjean Morn, PA-CPatient ID: Luis Acosta, male   DOB: [birth year], 34 y.o.   MRN: [id number]
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
## 2019-11-27 ENCOUNTER — Other Ambulatory Visit (HOSPITAL_COMMUNITY): Payer: Self-pay | Admitting: Medical

## 2019-11-27 NOTE — Progress Notes (Signed)
    Daily Group Progress Note  Program: CD-IOP   Group Time: 1pm-2:30pm Participation Level: Active Behavioral Response: Appropriate Type of Therapy: Process Group  Topic: Clinician checked in with group members, assessing for SI/HI/psychosis and overall level of functioning including cravings and substance use. Clinician and group members processed recent 'highs' and 'lows' identified triggers and coping skills which were used to address. Clinician and group members reviewed Daily Reflection and how the topic relates to them individually. Daily Reflection focused on surrender and Just For Today meditation focused on Learning to Love Ourselves.' Clinician and group members identified moments of gratitude.   Group Time: 2:30pm-4pm Participation Level: Active Behavioral Response: Appropriate Type of Therapy: Psycho-education Group  Topic: Clinician provided psychoeducational material related to Emotional Freedom Techniques, or Tapping. Clinician demonstrated points for clients and provided options for focusing. Clinician explained the goal of the Tapping procedure is to accept a feeling fully, acknowledge the thoughts the feeling creates, and create more helpful or realistic thoughts which in turn decrease the intensity of an unhelpful feeling. Clinician facilitated discussion with group members on the importance of fully identifying and feeling an emotion, uncomfortable or not. Clinician and group members participated in a guided tapping exercise to address cravings. Clinician and group members shared additional self soothing and mindfulness activities. Group members checked out with self care activity planned to support recovery.    Summary: Client checked in maintaining sobriety date of 09/23/19 and engagement in community support meetings showing progress toward goals of maintaining sobriety and building sober support. Client processed thoughts and feelings related to sisters substance use  and ways he has adapted to her behavior to avoid additional triggers. Client engaged in tapping activity. Client reported continuing improvement with physical activity and improvement in eating habits. Client does not have a plan for employment or ways to fill time following completion of CDIOP services.   Family Program: Family present? No   Name of family member(s): NA  UDS collected: No Results: NA  AA/NA attended?: Yes attended since group Thursday  Sponsor?: No   [name], LCSW

## 2019-11-28 ENCOUNTER — Other Ambulatory Visit (HOSPITAL_COMMUNITY): Payer: BC Managed Care – PPO | Admitting: Licensed Clinical Social Worker

## 2019-11-28 ENCOUNTER — Other Ambulatory Visit: Payer: Self-pay

## 2019-11-28 DIAGNOSIS — F319 Bipolar disorder, unspecified: Secondary | ICD-10-CM | POA: Diagnosis not present

## 2019-11-28 DIAGNOSIS — F1094 Alcohol use, unspecified with alcohol-induced mood disorder: Secondary | ICD-10-CM

## 2019-11-28 DIAGNOSIS — F1020 Alcohol dependence, uncomplicated: Secondary | ICD-10-CM

## 2019-11-29 ENCOUNTER — Encounter (HOSPITAL_COMMUNITY): Payer: Self-pay

## 2019-11-29 ENCOUNTER — Other Ambulatory Visit (INDEPENDENT_AMBULATORY_CARE_PROVIDER_SITE_OTHER): Payer: BC Managed Care – PPO | Admitting: Licensed Clinical Social Worker

## 2019-11-29 DIAGNOSIS — F1211 Cannabis abuse, in remission: Secondary | ICD-10-CM

## 2019-11-29 DIAGNOSIS — F1020 Alcohol dependence, uncomplicated: Secondary | ICD-10-CM

## 2019-11-29 DIAGNOSIS — Z6372 Alcoholism and drug addiction in family: Secondary | ICD-10-CM | POA: Diagnosis not present

## 2019-11-29 DIAGNOSIS — F1994 Other psychoactive substance use, unspecified with psychoactive substance-induced mood disorder: Secondary | ICD-10-CM

## 2019-11-29 DIAGNOSIS — I10 Essential (primary) hypertension: Secondary | ICD-10-CM

## 2019-11-29 DIAGNOSIS — F1411 Cocaine abuse, in remission: Secondary | ICD-10-CM

## 2019-11-29 DIAGNOSIS — G479 Sleep disorder, unspecified: Secondary | ICD-10-CM

## 2019-11-29 DIAGNOSIS — F319 Bipolar disorder, unspecified: Secondary | ICD-10-CM | POA: Diagnosis not present

## 2019-11-29 DIAGNOSIS — Z8739 Personal history of other diseases of the musculoskeletal system and connective tissue: Secondary | ICD-10-CM

## 2019-11-29 DIAGNOSIS — E669 Obesity, unspecified: Secondary | ICD-10-CM

## 2019-11-29 DIAGNOSIS — Z8659 Personal history of other mental and behavioral disorders: Secondary | ICD-10-CM

## 2019-11-30 ENCOUNTER — Encounter (HOSPITAL_COMMUNITY): Payer: Self-pay | Admitting: Medical
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
## 2019-12-02 NOTE — Progress Notes (Signed)
    Daily Group Progress Note  Program: CD-IOP   Group Time: 1pm-2pm Participation Level: Active Behavioral Response: Appropriate Type of Therapy: Psycho-education Group Topic: Psychoeducational presentation co-facilitated by Cleta Alberts. from [place]. STI and HIV psychoeducation was provided to clients. clients were provided with time to ask questions and discuss local resources.     Group Time: 2pm-4pm Participation Level: Active Behavioral Response: Appropriate Type of Therapy: Process Group Topic: Process group: Clinician checked in with clients, assessing for SI/HI/psychosis and overall level of functioning including cravings, barriers to sobriety, and highlights when skills were successfully used. Clinician and group processed 'Pain to Power' vocabulary and the importance of phrasing on feelings and intentions related to behaviors. Clinician allowed provided summarizing statements and praised clients identifying personal changes in perspective of situations. Clinician inquired about self care activity to be completed before next group.   Summary: Client was receptive to education on STI/HIV. Client shows progress toward goals AEB maintaining sobriety and continuing to improve physical health. Client needs continued progress on assertive communication when getting needs met.   Family Program: Family present? No   Name of family member(s): NA  UDS collected: No Results: none from 11/19/19  AA/NA attended?: No; intermittent engagement with [place]  Sponsor?: No   Olegario Messier, LCSW

## 2019-12-02 NOTE — Progress Notes (Signed)
    Daily Group Progress Note  Program: CD-IOP   Group Time: 1pm-2:30pm Participation Level: Minimal Behavioral Response: Appropriate Type of Therapy: Process Group Topic: Clinician checked in with group members, assessing for SI/HI/psychosis and overall level of functioning. Clinician processed with client recent successes and any barriers to recovery. Clinician and group members read Daily Reflection and JFT and processed spirituality vs religion in recovery, feeling safe in community support groups, and the effect of projection and distorted thinking on group engagement. Clinician praised group members reflecting and challenging each other's points of view.   Group Time: 2:30pm-4pm Participation Level: Minimal Behavioral Response: Appropriate Type of Therapy: Psycho-education Group  Topic:  Clinician provided Handful of Quiet [place]ebble meditation as a mindfulness practice in session. Clinician presentedCoping with Cravings and Urges handout from Co-Occurring Disorders Treatment Workbook. Clinician and group members reviewed Sober Self-Care list and personalized skills based on individual behaviors. Clinician presented the specific DBT skill of OppositeAction. Clinician inquired about self care activity to support recovery to be completed over the weekend.Clinician and group celebrated graduation of 3 members.   Summary: See PA-C note for additional details on client treatment summary and discharge. Client completes CDIOP successfully this day. Client shows progress toward supporting long term sobriety AEB improving eating and exercise habits and engaging in AA community. Client struggled to attend meetings regularly and has not obtained sponsor or temporary sponsor. Client reports decrease in anger outburst, previously identified as treatment goal. Client reports minimal conflict within the family at the time and awaits upcoming court date prior to making additional plans for work. Client  does have a need for continued work on assertive communication.  Family Program: Family present? No   Name of family member(s): NA  UDS collected: No Results: no UDS positive throughout treatment.  AA/NA attended?: Yes  Sponsor?: No   [name], LCSW

## 2019-12-02 NOTE — Progress Notes (Signed)
    Daily Group Progress Note  Program: CD-IOP   Group Time: 1pm-2:30pm Participation Level: Minimal Behavioral Response: Appropriate Type of Therapy: Process Group Topic: Clinician checked in with group members, assessing for SI/HI/psychosis and overall level of functioning. Clinician processed with client recent successes and any barriers to recovery. Clinician and group members read Daily Reflection and JFT and processed making decisions based on fear. Clinician provided time for group members to process recovery and uncertainty about specific higher power.  Client processed views on higher power and how identification or lack of identification has affected early recovery and their engagement in community support groups.  Group Time: 2:30pm-4pm Participation Level: Minimal Behavioral Response: Appropriate Type of Therapy: Psycho-education Group Topic: Clinician presented the Dorise Bullion of Addiction and Recovery and spoke with clients about 'milestones' notice. Clinician presented psychoeducation on Gorski's Developmental Model of Recovery and Relapse. Clinician also presented common early warning signs for substance abuse and mental health relapse including attitude/thinking changes, mood/emotional changes, behavioral changes, and changes in daily living or physical health. Clinician inquired self-care plan for the evening.   Summary: Client appears to actively listen to conversations, engaging when prompted. See PA note for discharge plans. Client shows progress toward goals AEB maintaining sobriety and improving overall physical health habits, reports continued concern about upcoming court dates.    Family Program: Family present? No   Name of family member(s): NA  UDS collected: No Results: n/a  AA/NA attended?: Yes  Sponsor?: No   [name], LCSW

## 2019-12-04 NOTE — Progress Notes (Signed)
    Daily Group Progress Note  Program: CD-IOP   Group Time: 1pm-2:30pm  Participation Level: Active Behavioral Response: Appropriate Type of Therapy: Process Group Topic:   Clinician checked in with group members, assessing for SI/HI/psychosis and overall level of functioning including triggers, cravings, or relapse. Clinician and group members reviewed sobriety date, community meetings attended, and positive interactions as well as interactions which could have gone better and how they were addressed. Clinician and group members read and discussed AA Daily Reflection and NA Just For Today with focus on humility, trusting the process of recovery, and acceptance of all parts of self. Clinician and group members processed the grief related to loss of lifestyle prior to recovery and thoughts and feelings related to being social around people who are able to drink responsibly.    Group Time: 2:30pm-4pm Participation Level: Active Behavioral Response: Appropriate Type of Therapy: Psycho-education Group Topic: Clinician presented Happy Brain: How to Overcome Our Neural Predispositions to Suffering by Dr. Verna Czech. Clinician presented psycho-educational information on automatic thinking and neural predispositions. Group members discussed negative adaptations including mind wandering, negativity bias. Educational information also included skills to improve moments of happiness by focusing on gratitude skills, non-judgmental stance, and reframing challenges to focus on compassion, acceptance, meaning, and forgiveness. Clinician and group members discussed recent life stressors and how these skills could be applied. Clinician presented the topic of mindfulness in conjunction with psycho-educational materials focused vs wandering modes of thinking. Clinician and group members discussed core features of mindfulness and barriers for implementation.    Summary: Client shares progress on physical health  and lack of making major life decisions until after court date. Client is receptive to psycho-education material and shared how mindfulness could be added to his day. Client updated on family stressors with plans of avoidance of stressors with sister and mother despite reporting engaging with them sometimes even when upset. Client does note no anger outburst overall.   Family Program: Family present? No   Name of family member(s): NA  UDS collected: No Results: prescription medications ly  AA/NA attended?: Yes  Sponsor?: No   [name], LCSW

## 2020-02-01 ENCOUNTER — Other Ambulatory Visit (HOSPITAL_COMMUNITY): Payer: Self-pay | Admitting: Medical

## 2020-02-13 ENCOUNTER — Other Ambulatory Visit (HOSPITAL_COMMUNITY): Payer: Self-pay | Admitting: Medical

## 2020-02-13 NOTE — Progress Notes (Signed)
Pt requesting refill No FU visits recorded He was to have gone to [facility] for Aftercare and meds.

## 2020-02-14 ENCOUNTER — Telehealth (HOSPITAL_COMMUNITY): Payer: Self-pay | Admitting: Licensed Clinical Social Worker

## 2020-02-14 ENCOUNTER — Telehealth (HOSPITAL_COMMUNITY): Payer: Self-pay

## 2020-02-14 NOTE — Telephone Encounter (Signed)
Received fax from pharmacy requesting a refill on his Bupropion 300mg  24 hr tab. Thank you.

## 2020-02-20 NOTE — Telephone Encounter (Signed)
Pt is no longer in CD IOP  Was referred to [facility] for aftercare and meds

## 2020-02-21 NOTE — Telephone Encounter (Signed)
Ok thank you for the update.

## 2020-04-15 ENCOUNTER — Other Ambulatory Visit (HOSPITAL_COMMUNITY): Payer: Self-pay | Admitting: Medical

## 2020-05-08 ENCOUNTER — Telehealth (HOSPITAL_COMMUNITY): Payer: BC Managed Care – PPO | Admitting: Medical
